# Patient Record
Sex: Male | Born: 1980 | Race: White | Hispanic: No | Marital: Married | Smoking: Current every day smoker
Health system: Southern US, Community
[De-identification: ages and names within clinical notes are randomized; demographics above are authoritative.]

## PROBLEM LIST (undated history)

## (undated) DIAGNOSIS — S069X9A Unspecified intracranial injury with loss of consciousness of unspecified duration, initial encounter: Secondary | ICD-10-CM

## (undated) DIAGNOSIS — I10 Essential (primary) hypertension: Secondary | ICD-10-CM

## (undated) DIAGNOSIS — F32A Depression, unspecified: Secondary | ICD-10-CM

## (undated) DIAGNOSIS — F419 Anxiety disorder, unspecified: Secondary | ICD-10-CM

## (undated) DIAGNOSIS — F329 Major depressive disorder, single episode, unspecified: Secondary | ICD-10-CM

## (undated) DIAGNOSIS — R569 Unspecified convulsions: Secondary | ICD-10-CM

## (undated) HISTORY — DX: Depression, unspecified: F32.A

## (undated) HISTORY — DX: Anxiety disorder, unspecified: F41.9

## (undated) HISTORY — DX: Unspecified convulsions: R56.9

## (undated) HISTORY — PX: HAND SURGERY: SHX662

## (undated) HISTORY — DX: Major depressive disorder, single episode, unspecified: F32.9

## (undated) HISTORY — DX: Unspecified intracranial injury with loss of consciousness of unspecified duration, initial encounter: S06.9X9A

## (undated) HISTORY — PX: OTHER SURGICAL HISTORY: SHX169

---

## 2001-05-19 ENCOUNTER — Emergency Department (HOSPITAL_COMMUNITY): Admission: EM | Admit: 2001-05-19 | Discharge: 2001-05-20 | Payer: Self-pay | Admitting: Emergency Medicine

## 2001-05-19 ENCOUNTER — Encounter: Payer: Self-pay | Admitting: Emergency Medicine

## 2007-02-22 ENCOUNTER — Emergency Department: Payer: Self-pay | Admitting: Emergency Medicine

## 2011-08-17 ENCOUNTER — Encounter: Payer: Self-pay | Admitting: *Deleted

## 2011-08-17 ENCOUNTER — Emergency Department (HOSPITAL_COMMUNITY)
Admission: EM | Admit: 2011-08-17 | Discharge: 2011-08-17 | Disposition: A | Discharge status: Other Acute Inpatient Hospital | Payer: Self-pay | Attending: Emergency Medicine | Admitting: Emergency Medicine

## 2011-08-17 DIAGNOSIS — F411 Generalized anxiety disorder: Secondary | ICD-10-CM | POA: Insufficient documentation

## 2011-08-17 DIAGNOSIS — S61209A Unspecified open wound of unspecified finger without damage to nail, initial encounter: Secondary | ICD-10-CM | POA: Insufficient documentation

## 2011-08-17 DIAGNOSIS — W268XXA Contact with other sharp object(s), not elsewhere classified, initial encounter: Secondary | ICD-10-CM | POA: Insufficient documentation

## 2011-08-17 DIAGNOSIS — F17200 Nicotine dependence, unspecified, uncomplicated: Secondary | ICD-10-CM | POA: Insufficient documentation

## 2011-08-17 DIAGNOSIS — M79609 Pain in unspecified limb: Secondary | ICD-10-CM | POA: Insufficient documentation

## 2011-08-17 DIAGNOSIS — IMO0001 Reserved for inherently not codable concepts without codable children: Secondary | ICD-10-CM

## 2011-08-17 MED ORDER — HYDROCODONE-ACETAMINOPHEN 5-325 MG PO TABS
2.0000 | ORAL_TABLET | Freq: Once | ORAL | Status: AC
  Administered 2011-08-17: 2 via ORAL
  Filled 2011-08-17: qty 2

## 2011-08-17 MED ORDER — IBUPROFEN 800 MG PO TABS
800.0000 mg | ORAL_TABLET | Freq: Once | ORAL | Status: AC
  Administered 2011-08-17: 800 mg via ORAL
  Filled 2011-08-17: qty 1

## 2011-08-17 MED ORDER — LIDOCAINE HCL (PF) 1 % IJ SOLN
5.0000 mL | Freq: Once | INTRAMUSCULAR | Status: AC
  Administered 2011-08-17: 5 mL

## 2011-08-17 MED ORDER — ONDANSETRON HCL 4 MG PO TABS
4.0000 mg | ORAL_TABLET | Freq: Once | ORAL | Status: AC
  Administered 2011-08-17: 4 mg via ORAL
  Filled 2011-08-17: qty 1

## 2011-08-17 MED ORDER — LIDOCAINE HCL 1 % IJ SOLN: 5.0000 mL | Freq: Once | INTRAMUSCULAR | Status: DC

## 2011-08-17 MED ORDER — HYDROCODONE-ACETAMINOPHEN 5-325 MG PO TABS: ORAL_TABLET | ORAL | Status: DC

## 2011-08-17 MED ORDER — LIDOCAINE HCL (PF) 1 % IJ SOLN
INTRAMUSCULAR | Status: AC
  Administered 2011-08-17: 5 mL
  Filled 2011-08-17: qty 5

## 2011-08-17 NOTE — ED Provider Notes (Signed)
History     CSN: [id number]  Arrival date & time 08/17/11  2016   None     Chief Complaint  Patient presents with  . Finger Injury    (Consider location/radiation/quality/duration/timing/severity/associated sxs/prior treatment) Patient is a 30 y.o. male presenting with skin laceration. The history is provided by the patient.  Laceration  The incident occurred 3 to 5 hours ago. The laceration is located on the left hand. The laceration is 2 cm in size. The laceration mechanism was a a metal edge. The pain is moderate. The pain has been constant since onset. He reports no foreign bodies present. His tetanus status is UTD.    History reviewed. No pertinent past medical history.  History reviewed. No pertinent past surgical history.  History reviewed. No pertinent family history.  History  Substance Use Topics  . Smoking status: Current Everyday Smoker  . Smokeless tobacco: Not on file  . Alcohol Use: No      Review of Systems  Constitutional: Negative for activity change.       All ROS Neg except as noted in HPI  HENT: Negative for nosebleeds and neck pain.   Eyes: Negative for photophobia and discharge.  Respiratory: Negative for cough, shortness of breath and wheezing.   Cardiovascular: Negative for chest pain and palpitations.  Gastrointestinal: Negative for abdominal pain and blood in stool.  Genitourinary: Negative for dysuria, frequency and hematuria.  Musculoskeletal: Negative for back pain and arthralgias.  Skin: Negative.   Neurological: Positive for headaches. Negative for dizziness, seizures and speech difficulty.  Psychiatric/Behavioral: Negative for hallucinations and confusion.    Allergies  Cortizone-5 and Penicillins  Home Medications   Current Outpatient Rx  Name Route Sig Dispense Refill  . BC HEADACHE POWDER PO Oral Take 1 packet by mouth daily as needed. For pain       BP 130/83  Pulse 78  Temp 98 F (36.7 C)  Resp 20  Ht 5\' 8"  (1.727  m)  Wt 150 lb (68.04 kg)  BMI 22.81 kg/m2  SpO2 99%  Physical Exam  Nursing note and vitals reviewed. Constitutional: He is oriented to person, place, and time. He appears well-developed and well-nourished.  Non-toxic appearance.  HENT:  Head: Normocephalic.  Right Ear: Tympanic membrane and external ear normal.  Left Ear: Tympanic membrane and external ear normal.  Eyes: EOM and lids are normal. Pupils are equal, round, and reactive to light.  Neck: Normal range of motion. Neck supple. Carotid bruit is not present.  Cardiovascular: Normal rate, regular rhythm, normal heart sounds, intact distal pulses and normal pulses.   Pulmonary/Chest: Breath sounds normal. No respiratory distress.  Abdominal: Soft. Bowel sounds are normal. There is no tenderness. There is no guarding.  Musculoskeletal: Normal range of motion.       Flap-type laceration of the medial aspect of the left third finger. No bone or tendon involvement. Sensory intact to touch and pain. Full range of motion of the finger without problem.  Lymphadenopathy:       Head (right side): No submandibular adenopathy present.       Head (left side): No submandibular adenopathy present.    He has no cervical adenopathy.  Neurological: He is alert and oriented to person, place, and time. He has normal strength. No cranial nerve deficit or sensory deficit.  Skin: Skin is warm and dry.  Psychiatric: His speech is normal. His mood appears anxious.    ED Course  LACERATION REPAIR Date/Time: 08/17/2011 10:06 PM  Performed by: Kathie Dike Authorized by: Kathie Dike Consent: Verbal consent obtained. Risks and benefits: risks, benefits and alternatives were discussed Consent given by: patient Patient understanding: patient states understanding of the procedure being performed Patient identity confirmed: verbally with patient Time out: Immediately prior to procedure a "time out" was called to verify the correct patient,  procedure, equipment, support staff and site/side marked as required. Body area: upper extremity Location details: left long finger Laceration length: 1.8 cm Foreign bodies: no foreign bodies Tendon involvement: none Nerve involvement: none Vascular damage: no Anesthesia: digital block Local anesthetic: lidocaine 1% without epinephrine Patient sedated: no Preparation: Patient was prepped and draped in the usual sterile fashion. Irrigation solution: saline Amount of cleaning: standard Debridement: none Degree of undermining: none Skin closure: 4-0 nylon Number of sutures: 4 Technique: simple Approximation: close Approximation difficulty: simple Dressing: 4x4 sterile gauze Patient tolerance: Patient tolerated the procedure well with no immediate complications.   (including critical care time) Pulse oximetry 99% on room air. Within normal limits by my interpretation. Labs Reviewed - No data to display No results found.   Dx: 1. Laceration left third finger.   MDM  I have reviewed nursing notes, vital signs, and all appropriate lab and imaging results for this patient. Patient advised to keep the laceration of the finger clean and dry. To have sutures removed in 7 days. To see his primary care physician or return to the emergency department if any changes problems or concerns.       Kathie Dike, [place] 08/17/11 2209
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
## 2011-08-17 NOTE — ED Notes (Signed)
Pt a/ox4. Resp even and unlabored. NAD at this time. D/C instructions reviewed with pt. Pt verbalized understanding. Pt ambulated to lobby with steady gate.  

## 2011-08-17 NOTE — ED Notes (Signed)
telfa and tube gauze dressing applied.

## 2011-08-18 NOTE — ED Provider Notes (Signed)
Medical screening examination/treatment/procedure(s) were performed by non-physician practitioner and as supervising physician I was immediately available for consultation/collaboration.   Chasey Dull W Quaniya Damas, MD 08/18/11 0002 

## 2014-09-07 ENCOUNTER — Emergency Department (HOSPITAL_COMMUNITY)
Admission: EM | Admit: 2014-09-07 | Discharge: 2014-09-08 | Discharge status: Against Medical Advice | Payer: Medicaid Other | Attending: Emergency Medicine | Admitting: Emergency Medicine

## 2014-09-07 ENCOUNTER — Encounter (HOSPITAL_COMMUNITY): Payer: Self-pay | Admitting: Emergency Medicine

## 2014-09-07 DIAGNOSIS — Z79899 Other long term (current) drug therapy: Secondary | ICD-10-CM | POA: Insufficient documentation

## 2014-09-07 DIAGNOSIS — R200 Anesthesia of skin: Secondary | ICD-10-CM | POA: Diagnosis not present

## 2014-09-07 DIAGNOSIS — Z880 Allergy status to penicillin: Secondary | ICD-10-CM | POA: Diagnosis not present

## 2014-09-07 DIAGNOSIS — R29898 Other symptoms and signs involving the musculoskeletal system: Secondary | ICD-10-CM

## 2014-09-07 DIAGNOSIS — M542 Cervicalgia: Secondary | ICD-10-CM | POA: Insufficient documentation

## 2014-09-07 DIAGNOSIS — R531 Weakness: Secondary | ICD-10-CM | POA: Insufficient documentation

## 2014-09-07 DIAGNOSIS — Z7982 Long term (current) use of aspirin: Secondary | ICD-10-CM | POA: Insufficient documentation

## 2014-09-07 DIAGNOSIS — R079 Chest pain, unspecified: Secondary | ICD-10-CM

## 2014-09-07 DIAGNOSIS — Z720 Tobacco use: Secondary | ICD-10-CM | POA: Insufficient documentation

## 2014-09-07 DIAGNOSIS — I10 Essential (primary) hypertension: Secondary | ICD-10-CM | POA: Diagnosis not present

## 2014-09-07 DIAGNOSIS — R42 Dizziness and giddiness: Secondary | ICD-10-CM | POA: Diagnosis not present

## 2014-09-07 HISTORY — DX: Essential (primary) hypertension: I10

## 2014-09-07 NOTE — ED Notes (Signed)
Pt. reports right side neck pain radiating to right jaw /right hand numbness onset this evening , denies injury , denies fever or neck stiffness.

## 2014-09-07 NOTE — ED Notes (Signed)
Pt is c/o of a tingling sensation on right side of face and right hand finger tips. Pt states he has been feeling dizzy for the last couple of days.

## 2014-09-08 ENCOUNTER — Encounter (HOSPITAL_COMMUNITY): Payer: Self-pay | Admitting: Radiology

## 2014-09-08 ENCOUNTER — Emergency Department (HOSPITAL_COMMUNITY): Payer: Medicaid Other

## 2014-09-08 ENCOUNTER — Other Ambulatory Visit (HOSPITAL_COMMUNITY): Payer: Medicaid Other

## 2014-09-08 LAB — TROPONIN I: Troponin I: <0.03 ng/mL (ref <0.031)

## 2014-09-08 LAB — COMPREHENSIVE METABOLIC PANEL
ALT: 13 U/L (ref 0–53)
AST: 17 U/L (ref 0–37)
Albumin: 3.7 g/dL (ref 3.5–5.2)
Alkaline Phosphatase: 63 U/L (ref 39–117)
Anion gap: 8 (ref 5–15)
BUN: 9 mg/dL (ref 6–23)
CO2: 26 mmol/L (ref 19–32)
Calcium: 9.3 mg/dL (ref 8.4–10.5)
Chloride: 106 mEq/L (ref 96–112)
Creatinine, Ser: 1.37 mg/dL — ABNORMAL HIGH (ref 0.50–1.35)
GFR calc Af Amer: 78 mL/min — ABNORMAL LOW (ref >90)
GFR calc non Af Amer: 67 mL/min — ABNORMAL LOW (ref >90)
Glucose, Bld: 98 mg/dL (ref 70–99)
Potassium: 4.1 mmol/L (ref 3.5–5.1)
Sodium: 140 mmol/L (ref 135–145)
Total Bilirubin: 0.3 mg/dL (ref 0.3–1.2)
Total Protein: 6.5 g/dL (ref 6.0–8.3)

## 2014-09-08 LAB — CBC WITH DIFFERENTIAL/PLATELET
Basophils Absolute: 0.1 10*3/uL (ref 0.0–0.1)
Basophils Relative: 0 % (ref 0–1)
Eosinophils Absolute: 0.5 10*3/uL (ref 0.0–0.7)
Eosinophils Relative: 3 % (ref 0–5)
HCT: 44.9 % (ref 39.0–52.0)
Hemoglobin: 15.4 g/dL (ref 13.0–17.0)
Lymphocytes Relative: 21 % (ref 12–46)
Lymphs Abs: 3.1 10*3/uL (ref 0.7–4.0)
MCH: 30.7 pg (ref 26.0–34.0)
MCHC: 34.3 g/dL (ref 30.0–36.0)
MCV: 89.6 fL (ref 78.0–100.0)
Monocytes Absolute: 1.2 10*3/uL — ABNORMAL HIGH (ref 0.1–1.0)
Monocytes Relative: 8 % (ref 3–12)
Neutro Abs: 10 10*3/uL — ABNORMAL HIGH (ref 1.7–7.7)
Neutrophils Relative %: 68 % (ref 43–77)
Platelets: 391 10*3/uL (ref 150–400)
RBC: 5.01 MIL/uL (ref 4.22–5.81)
RDW: 13 % (ref 11.5–15.5)
WBC: 14.8 10*3/uL — ABNORMAL HIGH (ref 4.0–10.5)

## 2014-09-08 LAB — PROTIME-INR
INR: 1.05 (ref 0.00–1.49)
Prothrombin Time: 13.8 seconds (ref 11.6–15.2)

## 2014-09-08 MED ORDER — SODIUM CHLORIDE 0.9 % IV BOLUS (SEPSIS)
500.0000 mL | Freq: Once | INTRAVENOUS | Status: AC
  Administered 2014-09-08: 500 mL via INTRAVENOUS

## 2014-09-08 MED ORDER — IOHEXOL 350 MG/ML SOLN
50.0000 mL | Freq: Once | INTRAVENOUS | Status: AC | PRN
  Administered 2014-09-08: 50 mL via INTRAVENOUS

## 2014-09-08 MED ORDER — NICOTINE 21 MG/24HR TD PT24
21.0000 mg | MEDICATED_PATCH | Freq: Every day | TRANSDERMAL | Status: DC
  Filled 2014-09-08: qty 1

## 2014-09-08 NOTE — ED Provider Notes (Signed)
CSN: [id number]     Arrival date & time 09/07/14  2326 History  This chart was scribed for Purvis SheffieldForrest Corinn Stoltzfus, MD by Abel PrestoKara Demonbreun, ED Scribe. This patient was seen in room D32C/D32C and the patient's care was started at 12:02 AM.     Chief Complaint  Patient presents with  . Neck Pain    Patient is a 34 y.o. male presenting with neck pain. The history is provided by the patient. No language interpreter was used.  Neck Pain Associated symptoms: chest pain (resolved), numbness and weakness   Associated symptoms: no headaches     HPI Comments: Raymond Mckinney is a 34 y.o. male with PMHx of HTN who presents to the Emergency Department complaining of intermittent generalized throbbing neck pain with onset around 3 AM 3 days ago. Pt notes constant chest pain 3 days ago which has resolved. Pt states it may have been due to anxiety.  Pt attended a funeral on that day. Pt states neck pain is worse on the right today. Pt notes around 10 PM this evening he felt dizzy and noticed right sided tingling in jaw and cheek and right arm with associated weakness, causing him to drop the phone he was holding. Pt states his right side felt numb following this episode. Pt notes symptoms lasted between 20-30 minutes with constant dizziness for 15 minutes. Pt states wife noted right sided facial droop.  Pt is a smoker and notes occasional EtOH use.  Pt is allergic to Cortizone 10 and penicillin. Pt denies recent surgeries, headache, and visual disturbances.   Past Medical History  Diagnosis Date  . Hypertension    History reviewed. No pertinent past surgical history. No family history on file. History  Substance Use Topics  . Smoking status: Current Every Day Smoker  . Smokeless tobacco: Not on file  . Alcohol Use: No    Review of Systems  Constitutional: Negative for appetite change and fatigue.  HENT: Negative for congestion, ear discharge and sinus pressure.   Eyes: Negative for discharge and visual  disturbance.  Respiratory: Negative for cough.   Cardiovascular: Positive for chest pain (resolved).  Gastrointestinal: Negative for abdominal pain and diarrhea.  Genitourinary: Negative for frequency and hematuria.  Musculoskeletal: Positive for neck pain. Negative for back pain.  Skin: Negative for rash.  Neurological: Positive for dizziness, weakness and numbness. Negative for seizures and headaches.  Psychiatric/Behavioral: Negative for hallucinations.  All other systems reviewed and are negative.     Allergies  Cortizone-5 and Penicillins  Home Medications   Prior to Admission medications   Medication Sig Start Date End Date Taking? Authorizing Provider  Aspirin-Salicylamide-Caffeine (BC HEADACHE POWDER PO) Take 1 packet by mouth daily as needed. For pain     Historical Provider, MD  HYDROcodone-acetaminophen [facility]) 5-325 MG per tablet 1 or 2 po q4h prn pain 08/17/11   Kathie DikeHobson M Bryant, PA-C   BP 126/76 mmHg  Pulse 95  Temp(Src) 97.7 F (36.5 C) (Oral)  Resp 16  Ht 5\' 8"  (1.727 m)  Wt 150 lb (68.04 kg)  BMI 22.81 kg/m2  SpO2 100% Physical Exam  Constitutional: He is oriented to person, place, and time. He appears well-developed.  HENT:  Head: Normocephalic.  Mouth/Throat: Oropharynx is clear and moist.  Eyes: Conjunctivae and EOM are normal. Pupils are equal, round, and reactive to light. No scleral icterus.  Neck: Normal range of motion. Neck supple. Carotid bruit is not present. No thyromegaly present.  Cardiovascular: Normal rate and regular  rhythm.  Exam reveals no gallop and no friction rub.   No murmur heard. Pulmonary/Chest: No stridor. He has no wheezes. He has no rales. He exhibits no tenderness.  Abdominal: He exhibits no distension. There is no tenderness. There is no rebound.  Musculoskeletal: Normal range of motion. He exhibits no edema.  Lymphadenopathy:    He has no cervical adenopathy.  Neurological: He is oriented to person, place, and time. He  exhibits normal muscle tone. Coordination normal.  alert, oriented x3 speech: normal in context and clarity memory: intact grossly cranial nerves II-XII: intact motor strength: full proximally and distally no involuntary movements or tremors sensation: intact to light touch diffusely  cerebellar: finger-to-nose and heel-to-shin intact gait: normal forwards and backwards  Skin: No rash noted. No erythema.  Psychiatric: He has a normal mood and affect. His behavior is normal.  Nursing note and vitals reviewed.   ED Course  Procedures (including critical care time) DIAGNOSTIC STUDIES: Oxygen Saturation is 100% on room air, normal by my interpretation.    COORDINATION OF CARE: 12:13 AM Discussed treatment plan with patient at beside, the patient agrees with the plan and has no further questions at this time.   Labs Review Labs Reviewed  CBC WITH DIFFERENTIAL - Abnormal; Notable for the following:    WBC 14.8 (*)    Neutro Abs 10.0 (*)    Monocytes Absolute 1.2 (*)    All other components within normal limits  COMPREHENSIVE METABOLIC PANEL - Abnormal; Notable for the following:    Creatinine, Ser 1.37 (*)    GFR calc non Af Amer 67 (*)    GFR calc Af Amer 78 (*)    All other components within normal limits  TROPONIN I  PROTIME-INR    Imaging Review Ct Angio Head W/cm &/or Wo Cm  09/08/2014   CLINICAL DATA:  Neck pain on the right. Unspecified chest pain. Weakness of the right hand and tingling of the right face.  EXAM: CT ANGIOGRAPHY HEAD AND NECK  TECHNIQUE: Multidetector CT imaging of the head and neck was performed using the standard protocol during bolus administration of intravenous contrast. Multiplanar CT image reconstructions and MIPs were obtained to evaluate the vascular anatomy. Carotid stenosis measurements (when applicable) are obtained utilizing NASCET criteria, using the distal internal carotid diameter as the denominator.  CONTRAST:  50mL OMNIPAQUE IOHEXOL 350  MG/ML SOLN  COMPARISON:  None.  FINDINGS: CT HEAD  Skull and Sinuses:Mucosal thickening and fluid levels in the bilateral maxillary sinuses.  Orbits: No acute abnormality.  Brain: No evidence of acute infarction, hemorrhage, hydrocephalus, or mass lesion/mass effect.  CTA NECK  Aortic arch: Minimal imaging shows no evidence of dissection or aneurysm. There is standard branching of the aortic arch.  Right carotid system: No evidence of dissection, vasculopathy, stenosis, or atherosclerosis. Size is symmetric.  Left carotid system: No evidence of dissection, vasculopathy, stenosis, or atherosclerosis. Size is symmetric.  Vertebral arteries: Slight left dominance. No stenosis, vasculopathy, or dissection.  CTA HEAD  Anterior circulation: There is no aneurysm, stenosis, or atherosclerotic change. No evidence of vascular malformation.  Posterior circulation: There is no aneurysm, stenosis, or atherosclerotic change. No evidence of vascular malformation.  Venous sinuses: Patent.  Anatomic variants: Fetal type left PCA. Extradural origin of the right PICA.  Delayed phase: Normal.  Skull and Sinuses:No findings to explain the history.  IMPRESSION: 1. Negative CTA of the head and neck. 2. Bilateral maxillary sinusitis.   Electronically Signed   By: Christiane Ha  Watts M.D.   On: 09/08/2014 01:48   Dg Chest 2 View  09/08/2014   CLINICAL DATA:  Chest pain, RIGHT neck pain beginning 3 days ago after attending a funeral. Dizziness for a few days.  EXAM: CHEST  2 VIEW  COMPARISON:  Chest radiograph June 19, 2014  FINDINGS: Cardiomediastinal silhouette is unremarkable. Mild bronchitic changes. The lungs are clear without pleural effusions or focal consolidations. Trachea projects midline and there is no pneumothorax. Soft tissue planes and included osseous structures are non-suspicious.  IMPRESSION: Mild bronchitic change without focal consolidation.   Electronically Signed   By: Awilda Metro   On: 09/08/2014 00:36   Ct  Angio Neck W/cm &/or Wo/cm  09/08/2014   CLINICAL DATA:  Neck pain on the right. Unspecified chest pain. Weakness of the right hand and tingling of the right face.  EXAM: CT ANGIOGRAPHY HEAD AND NECK  TECHNIQUE: Multidetector CT imaging of the head and neck was performed using the standard protocol during bolus administration of intravenous contrast. Multiplanar CT image reconstructions and MIPs were obtained to evaluate the vascular anatomy. Carotid stenosis measurements (when applicable) are obtained utilizing NASCET criteria, using the distal internal carotid diameter as the denominator.  CONTRAST:  50mL OMNIPAQUE IOHEXOL 350 MG/ML SOLN  COMPARISON:  None.  FINDINGS: CT HEAD  Skull and Sinuses:Mucosal thickening and fluid levels in the bilateral maxillary sinuses.  Orbits: No acute abnormality.  Brain: No evidence of acute infarction, hemorrhage, hydrocephalus, or mass lesion/mass effect.  CTA NECK  Aortic arch: Minimal imaging shows no evidence of dissection or aneurysm. There is standard branching of the aortic arch.  Right carotid system: No evidence of dissection, vasculopathy, stenosis, or atherosclerosis. Size is symmetric.  Left carotid system: No evidence of dissection, vasculopathy, stenosis, or atherosclerosis. Size is symmetric.  Vertebral arteries: Slight left dominance. No stenosis, vasculopathy, or dissection.  CTA HEAD  Anterior circulation: There is no aneurysm, stenosis, or atherosclerotic change. No evidence of vascular malformation.  Posterior circulation: There is no aneurysm, stenosis, or atherosclerotic change. No evidence of vascular malformation.  Venous sinuses: Patent.  Anatomic variants: Fetal type left PCA. Extradural origin of the right PICA.  Delayed phase: Normal.  Skull and Sinuses:No findings to explain the history.  IMPRESSION: 1. Negative CTA of the head and neck. 2. Bilateral maxillary sinusitis.   Electronically Signed   By: Tiburcio Pea M.D.   On: 09/08/2014 01:48      EKG Interpretation   Date/Time:  Wednesday September 08 2014 00:17:10 EST Ventricular Rate:  86 PR Interval:  147 QRS Duration: 83 QT Interval:  344 QTC Calculation: 411 R Axis:   79 Text Interpretation:  Sinus rhythm ST elev, probable normal early repol  pattern Confirmed by Genevieve Arbaugh  MD, Roselynn Whitacre ([id number]) on 09/08/2014 12:22:15 AM      MDM   Final diagnoses:  Chest pain  Neck pain on right side  Right hand weakness   1:02 AM 34 y.o. male pw right hand weakness tingling, right jaw tingling, dizziness of sudden onset around 10pm. Sx resolved after about 15-20 min. Ant neck pain, worse on right for several days. No fevers, HA's, or cp. AFVSS here. Normal neuro exam. Will get CTA head/neck.   I discussed the case with neurology who recommended a MRI. I informed the patient. He seemed to be in agreement, but eloped w/out warning shortly after our discussion. His workup was thus far noncontributory. I did not get to give him return precautions. He had remained asx  on exam w/ the exception of some tingling in the fingertips of his right hand.     I personally performed the services described in this documentation, which was scribed in my presence. The recorded information has been reviewed and is accurate.      Purvis Sheffield, MD 09/08/14 [phone]
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
## 2014-11-08 ENCOUNTER — Emergency Department (HOSPITAL_COMMUNITY)
Admission: EM | Admit: 2014-11-08 | Discharge: 2014-11-09 | Disposition: A | Discharge status: Home / Self Care | Payer: Medicaid Other | Attending: Emergency Medicine | Admitting: Emergency Medicine

## 2014-11-08 ENCOUNTER — Emergency Department (HOSPITAL_COMMUNITY): Payer: Medicaid Other

## 2014-11-08 ENCOUNTER — Encounter (HOSPITAL_COMMUNITY): Payer: Self-pay | Admitting: Emergency Medicine

## 2014-11-08 DIAGNOSIS — Y998 Other external cause status: Secondary | ICD-10-CM | POA: Insufficient documentation

## 2014-11-08 DIAGNOSIS — Y929 Unspecified place or not applicable: Secondary | ICD-10-CM | POA: Insufficient documentation

## 2014-11-08 DIAGNOSIS — Z880 Allergy status to penicillin: Secondary | ICD-10-CM | POA: Diagnosis not present

## 2014-11-08 DIAGNOSIS — Z720 Tobacco use: Secondary | ICD-10-CM | POA: Diagnosis not present

## 2014-11-08 DIAGNOSIS — T1490XA Injury, unspecified, initial encounter: Secondary | ICD-10-CM

## 2014-11-08 DIAGNOSIS — I10 Essential (primary) hypertension: Secondary | ICD-10-CM | POA: Insufficient documentation

## 2014-11-08 DIAGNOSIS — Y9389 Activity, other specified: Secondary | ICD-10-CM | POA: Insufficient documentation

## 2014-11-08 DIAGNOSIS — S61207A Unspecified open wound of left little finger without damage to nail, initial encounter: Secondary | ICD-10-CM | POA: Diagnosis present

## 2014-11-08 DIAGNOSIS — W3400XA Accidental discharge from unspecified firearms or gun, initial encounter: Secondary | ICD-10-CM | POA: Insufficient documentation

## 2014-11-08 DIAGNOSIS — S61239A Puncture wound without foreign body of unspecified finger without damage to nail, initial encounter: Secondary | ICD-10-CM

## 2014-11-08 MED ORDER — OXYCODONE-ACETAMINOPHEN 5-325 MG PO TABS
1.0000 | ORAL_TABLET | Freq: Once | ORAL | Status: AC
  Administered 2014-11-08: 1 via ORAL
  Filled 2014-11-08: qty 1

## 2014-11-08 MED ORDER — OXYCODONE-ACETAMINOPHEN 5-325 MG PO TABS: 1.0000 | ORAL_TABLET | Freq: Four times a day (QID) | ORAL | Status: DC | PRN

## 2014-11-08 MED ORDER — CEPHALEXIN 500 MG PO CAPS: 500.0000 mg | ORAL_CAPSULE | Freq: Four times a day (QID) | ORAL | Status: DC
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
## 2014-11-08 NOTE — ED Notes (Signed)
Ortho tech called to apply ulnar gutter

## 2014-11-08 NOTE — ED Notes (Signed)
Ortho tech at bedside 
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
## 2014-11-08 NOTE — ED Notes (Signed)
Pt transported to xray 

## 2014-12-20 ENCOUNTER — Emergency Department (HOSPITAL_COMMUNITY): Payer: Medicaid Other

## 2014-12-20 ENCOUNTER — Emergency Department (HOSPITAL_COMMUNITY)
Admission: EM | Admit: 2014-12-20 | Discharge: 2014-12-21 | Disposition: A | Discharge status: Home / Self Care | Payer: Medicaid Other | Attending: Emergency Medicine | Admitting: Emergency Medicine

## 2014-12-20 ENCOUNTER — Encounter (HOSPITAL_COMMUNITY): Payer: Self-pay

## 2014-12-20 DIAGNOSIS — R4781 Slurred speech: Secondary | ICD-10-CM | POA: Diagnosis not present

## 2014-12-20 DIAGNOSIS — Y9389 Activity, other specified: Secondary | ICD-10-CM | POA: Insufficient documentation

## 2014-12-20 DIAGNOSIS — Y92410 Unspecified street and highway as the place of occurrence of the external cause: Secondary | ICD-10-CM | POA: Diagnosis not present

## 2014-12-20 DIAGNOSIS — F10120 Alcohol abuse with intoxication, uncomplicated: Secondary | ICD-10-CM | POA: Diagnosis not present

## 2014-12-20 DIAGNOSIS — S3992XA Unspecified injury of lower back, initial encounter: Secondary | ICD-10-CM | POA: Diagnosis not present

## 2014-12-20 DIAGNOSIS — Z720 Tobacco use: Secondary | ICD-10-CM | POA: Diagnosis not present

## 2014-12-20 DIAGNOSIS — S80812A Abrasion, left lower leg, initial encounter: Secondary | ICD-10-CM | POA: Insufficient documentation

## 2014-12-20 DIAGNOSIS — Y998 Other external cause status: Secondary | ICD-10-CM | POA: Insufficient documentation

## 2014-12-20 DIAGNOSIS — E876 Hypokalemia: Secondary | ICD-10-CM | POA: Diagnosis not present

## 2014-12-20 DIAGNOSIS — S80811A Abrasion, right lower leg, initial encounter: Secondary | ICD-10-CM | POA: Insufficient documentation

## 2014-12-20 DIAGNOSIS — F10920 Alcohol use, unspecified with intoxication, uncomplicated: Secondary | ICD-10-CM

## 2014-12-20 DIAGNOSIS — S8992XA Unspecified injury of left lower leg, initial encounter: Secondary | ICD-10-CM | POA: Diagnosis present

## 2014-12-20 LAB — PREPARE FRESH FROZEN PLASMA
Unit division: 0
Unit division: 0

## 2014-12-20 LAB — URINALYSIS, ROUTINE W REFLEX MICROSCOPIC
Bilirubin Urine: NEGATIVE
Glucose, UA: NEGATIVE mg/dL
Ketones, ur: NEGATIVE mg/dL
Leukocytes, UA: NEGATIVE
Nitrite: NEGATIVE
Protein, ur: NEGATIVE mg/dL
Specific Gravity, Urine: 1.005 (ref 1.005–1.030)
Urobilinogen, UA: 0.2 mg/dL (ref 0.0–1.0)
pH: 5.5 (ref 5.0–8.0)

## 2014-12-20 LAB — CBC WITH DIFFERENTIAL/PLATELET
Basophils Absolute: 0.1 10*3/uL (ref 0.0–0.1)
Basophils Relative: 1 % (ref 0–1)
Eosinophils Absolute: 0.2 10*3/uL (ref 0.0–0.7)
Eosinophils Relative: 2 % (ref 0–5)
HCT: 45.9 % (ref 39.0–52.0)
Hemoglobin: 15.6 g/dL (ref 13.0–17.0)
Lymphocytes Relative: 34 % (ref 12–46)
Lymphs Abs: 3.4 10*3/uL (ref 0.7–4.0)
MCH: 30.4 pg (ref 26.0–34.0)
MCHC: 34 g/dL (ref 30.0–36.0)
MCV: 89.3 fL (ref 78.0–100.0)
Monocytes Absolute: 0.7 10*3/uL (ref 0.1–1.0)
Monocytes Relative: 7 % (ref 3–12)
Neutro Abs: 5.8 10*3/uL (ref 1.7–7.7)
Neutrophils Relative %: 56 % (ref 43–77)
Platelets: 359 10*3/uL (ref 150–400)
RBC: 5.14 MIL/uL (ref 4.22–5.81)
RDW: 13.4 % (ref 11.5–15.5)
WBC: 10.1 10*3/uL (ref 4.0–10.5)

## 2014-12-20 LAB — COMPREHENSIVE METABOLIC PANEL
ALT: 24 U/L (ref 17–63)
AST: 29 U/L (ref 15–41)
Albumin: 4 g/dL (ref 3.5–5.0)
Alkaline Phosphatase: 69 U/L (ref 38–126)
Anion gap: 13 (ref 5–15)
BUN: 6 mg/dL (ref 6–20)
CO2: 19 mmol/L — ABNORMAL LOW (ref 22–32)
Calcium: 8.8 mg/dL — ABNORMAL LOW (ref 8.9–10.3)
Chloride: 111 mmol/L (ref 101–111)
Creatinine, Ser: 1.03 mg/dL (ref 0.61–1.24)
GFR calc Af Amer: >60 mL/min (ref >60)
GFR calc non Af Amer: >60 mL/min (ref >60)
Glucose, Bld: 91 mg/dL (ref 70–99)
Potassium: 2.9 mmol/L — ABNORMAL LOW (ref 3.5–5.1)
Sodium: 143 mmol/L (ref 135–145)
Total Bilirubin: 0.5 mg/dL (ref 0.3–1.2)
Total Protein: 7.2 g/dL (ref 6.5–8.1)

## 2014-12-20 LAB — TYPE AND SCREEN
ABO/RH(D): A POS
Antibody Screen: NEGATIVE
Unit division: 0
Unit division: 0

## 2014-12-20 LAB — I-STAT CHEM 8, ED
BUN: 6 mg/dL (ref 6–20)
Calcium, Ion: 1.11 mmol/L — ABNORMAL LOW (ref 1.12–1.23)
Chloride: 109 mmol/L (ref 101–111)
Creatinine, Ser: 1.4 mg/dL — ABNORMAL HIGH (ref 0.61–1.24)
Glucose, Bld: 87 mg/dL (ref 70–99)
HCT: 50 % (ref 39.0–52.0)
Hemoglobin: 17 g/dL (ref 13.0–17.0)
Potassium: 3 mmol/L — ABNORMAL LOW (ref 3.5–5.1)
Sodium: 146 mmol/L — ABNORMAL HIGH (ref 135–145)
TCO2: 16 mmol/L (ref 0–100)

## 2014-12-20 LAB — URINE MICROSCOPIC-ADD ON

## 2014-12-20 LAB — CDS SEROLOGY

## 2014-12-20 LAB — ETHANOL: Alcohol, Ethyl (B): 283 mg/dL — ABNORMAL HIGH (ref <5)

## 2014-12-20 LAB — LACTIC ACID, PLASMA: Lactic Acid, Venous: 2.4 mmol/L (ref 0.5–2.0)

## 2014-12-20 LAB — PROTIME-INR
INR: 1.05 (ref 0.00–1.49)
Prothrombin Time: 13.8 seconds (ref 11.6–15.2)

## 2014-12-20 MED ORDER — HALOPERIDOL LACTATE 5 MG/ML IJ SOLN
INTRAMUSCULAR | Status: AC
  Administered 2014-12-20: 5 mg
  Filled 2014-12-20: qty 1

## 2014-12-20 MED ORDER — FENTANYL CITRATE (PF) 100 MCG/2ML IJ SOLN
50.0000 ug | Freq: Once | INTRAMUSCULAR | Status: AC
  Administered 2014-12-20: 50 ug via INTRAVENOUS
  Filled 2014-12-20: qty 2

## 2014-12-20 MED ORDER — POTASSIUM CHLORIDE 20 MEQ/15ML (10%) PO SOLN
40.0000 meq | Freq: Once | ORAL | Status: AC
  Administered 2014-12-20: 40 meq via ORAL
  Filled 2014-12-20: qty 30

## 2014-12-20 MED ORDER — SODIUM CHLORIDE 0.9 % IV BOLUS (SEPSIS)
1000.0000 mL | Freq: Once | INTRAVENOUS | Status: AC
  Administered 2014-12-20: 1000 mL via INTRAVENOUS
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
## 2014-12-21 ENCOUNTER — Encounter (HOSPITAL_COMMUNITY): Payer: Self-pay | Admitting: Radiology

## 2014-12-21 ENCOUNTER — Emergency Department (HOSPITAL_COMMUNITY): Payer: Medicaid Other

## 2014-12-21 LAB — ABO/RH: ABO/RH(D): A POS

## 2014-12-21 MED ORDER — POTASSIUM CHLORIDE IN NACL 20-0.9 MEQ/L-% IV SOLN
Freq: Once | INTRAVENOUS | Status: AC
Start: 2014-12-21 — End: 2014-12-21
  Administered 2014-12-21: 02:00:00 via INTRAVENOUS
  Filled 2014-12-21: qty 1000

## 2014-12-21 MED ORDER — FENTANYL CITRATE (PF) 100 MCG/2ML IJ SOLN
50.0000 ug | INTRAMUSCULAR | Status: DC | PRN
  Filled 2014-12-21: qty 2

## 2014-12-21 MED ORDER — IBUPROFEN 600 MG PO TABS: 600.0000 mg | ORAL_TABLET | Freq: Four times a day (QID) | ORAL | Status: DC | PRN

## 2014-12-21 MED ORDER — CYCLOBENZAPRINE HCL 10 MG PO TABS
10.0000 mg | ORAL_TABLET | Freq: Two times a day (BID) | ORAL | Status: DC | PRN
Start: 2014-12-21 — End: 2015-04-19

## 2014-12-21 MED ORDER — IOHEXOL 300 MG/ML  SOLN: 80.0000 mL | Freq: Once | INTRAMUSCULAR | Status: DC | PRN

## 2014-12-21 NOTE — Discharge Instructions (Signed)
Alcohol Intoxication  Alcohol intoxication occurs when the amount of alcohol that a person has consumed impairs his or her ability to mentally and physically function. Alcohol directly impairs the normal chemical activity of the brain. Drinking large amounts of alcohol can lead to changes in mental function and behavior, and it can cause many physical effects that can be harmful.   Alcohol intoxication can range in severity from mild to very severe. Various factors can affect the level of intoxication that occurs, such as the person's age, gender, weight, frequency of alcohol consumption, and the presence of other medical conditions (such as diabetes, seizures, or heart conditions). Dangerous levels of alcohol intoxication may occur when people drink large amounts of alcohol in a short period (binge drinking). Alcohol can also be especially dangerous when combined with certain prescription medicines or "recreational" drugs.  SIGNS AND SYMPTOMS  Some common signs and symptoms of mild alcohol intoxication include:  · Loss of coordination.  · Changes in mood and behavior.  · Impaired judgment.  · Slurred speech.  As alcohol intoxication progresses to more severe levels, other signs and symptoms will appear. These may include:  · Vomiting.  · Confusion and impaired memory.  · Slowed breathing.  · Seizures.  · Loss of consciousness.  DIAGNOSIS   Your health care provider will take a medical history and perform a physical exam. You will be asked about the amount and type of alcohol you have consumed. Blood tests will be done to measure the concentration of alcohol in your blood. In many places, your blood alcohol level must be lower than 80 mg/dL (0.08%) to legally drive. However, many dangerous effects of alcohol can occur at much lower levels.   TREATMENT   People with alcohol intoxication often do not require treatment. Most of the effects of alcohol intoxication are temporary, and they go away as the alcohol naturally  leaves the body. Your health care provider will monitor your condition until you are stable enough to go home. Fluids are sometimes given through an IV access tube to help prevent dehydration.   HOME CARE INSTRUCTIONS  · Do not drive after drinking alcohol.  · Stay hydrated. Drink enough water and fluids to keep your urine clear or pale yellow. Avoid caffeine.    · Only take over-the-counter or prescription medicines as directed by your health care provider.    SEEK MEDICAL CARE IF:   · You have persistent vomiting.    · You do not feel better after a few days.  · You have frequent alcohol intoxication. Your health care provider can help determine if you should see a substance use treatment counselor.  SEEK IMMEDIATE MEDICAL CARE IF:   · You become shaky or tremble when you try to stop drinking.    · You shake uncontrollably (seizure).    · You throw up (vomit) blood. This may be bright red or may look like black coffee grounds.    · You have blood in your stool. This may be bright red or may appear as a black, tarry, bad smelling stool.    · You become lightheaded or faint.    MAKE SURE YOU:   · Understand these instructions.  · Will watch your condition.  · Will get help right away if you are not doing well or get worse.  Document Released: 05/16/2005 Document Revised: 04/08/2013 Document Reviewed: 01/09/2013  [employer]® Patient Information ©2015 [employer]. This information is not intended to replace advice given to you by your health care provider. Make sure   you discuss any questions you have with your health care provider.

## 2014-12-21 NOTE — ED Notes (Signed)
Patient transported to CT 

## 2014-12-21 NOTE — ED Notes (Signed)
Patient back in room and is agreeing to have an IV restarted but does not want to have any blood drawn.  Dr Delford FieldWright talked with patient and he is willing to have the CT done.  Patient talking with [employer] and is agreeable to having this nurse start another IV

## 2014-12-22 ENCOUNTER — Encounter (HOSPITAL_COMMUNITY): Payer: Self-pay | Admitting: Emergency Medicine

## 2015-04-19 ENCOUNTER — Emergency Department (HOSPITAL_COMMUNITY): Payer: Medicaid Other

## 2015-04-19 ENCOUNTER — Emergency Department (HOSPITAL_COMMUNITY)
Admission: EM | Admit: 2015-04-19 | Discharge: 2015-04-19 | Disposition: A | Discharge status: Home / Self Care | Payer: Medicaid Other | Attending: Emergency Medicine | Admitting: Emergency Medicine

## 2015-04-19 ENCOUNTER — Encounter (HOSPITAL_COMMUNITY): Payer: Self-pay

## 2015-04-19 DIAGNOSIS — R200 Anesthesia of skin: Secondary | ICD-10-CM | POA: Insufficient documentation

## 2015-04-19 DIAGNOSIS — Z792 Long term (current) use of antibiotics: Secondary | ICD-10-CM | POA: Insufficient documentation

## 2015-04-19 DIAGNOSIS — Z880 Allergy status to penicillin: Secondary | ICD-10-CM | POA: Diagnosis not present

## 2015-04-19 DIAGNOSIS — I10 Essential (primary) hypertension: Secondary | ICD-10-CM | POA: Diagnosis not present

## 2015-04-19 DIAGNOSIS — Z720 Tobacco use: Secondary | ICD-10-CM | POA: Diagnosis not present

## 2015-04-19 DIAGNOSIS — M5412 Radiculopathy, cervical region: Secondary | ICD-10-CM | POA: Insufficient documentation

## 2015-04-19 DIAGNOSIS — M542 Cervicalgia: Secondary | ICD-10-CM | POA: Diagnosis present

## 2015-04-19 MED ORDER — CYCLOBENZAPRINE HCL 10 MG PO TABS: 10.0000 mg | ORAL_TABLET | Freq: Three times a day (TID) | ORAL | Status: DC | PRN

## 2015-04-19 MED ORDER — NAPROXEN 500 MG PO TABS: 500.0000 mg | ORAL_TABLET | Freq: Two times a day (BID) | ORAL | Status: DC

## 2015-04-19 MED ORDER — OXYCODONE-ACETAMINOPHEN 5-325 MG PO TABS
1.0000 | ORAL_TABLET | Freq: Once | ORAL | Status: AC
  Administered 2015-04-19: 1 via ORAL
  Filled 2015-04-19: qty 1

## 2015-04-19 MED ORDER — OXYCODONE-ACETAMINOPHEN 5-325 MG PO TABS: 1.0000 | ORAL_TABLET | ORAL | Status: DC | PRN

## 2015-04-19 NOTE — Discharge Instructions (Signed)
Cervical Radiculopathy °Cervical radiculopathy means a nerve in the neck is pinched or bruised. This can cause pain or loss of feeling (numbness) that runs from your neck to your arm and fingers. °HOME CARE  °· Put ice on the injured or painful area. °¨ Put ice in a plastic bag. °¨ Place a towel between your skin and the bag. °¨ Leave the ice on for 15-20 minutes, 03-04 times a day, or as told by your doctor. °· If ice does not help, you can try using heat. Take a warm shower or bath, or use a hot water bottle as told by your doctor. °· You may try a gentle neck and shoulder massage. °· Use a flat pillow when you sleep. °· Only take medicines as told by your doctor. °· Keep all physical therapy visits as told by your doctor. °· If you are given a soft collar, wear it as told by your doctor. °GET HELP RIGHT AWAY IF:  °· Your pain gets worse and is not controlled with medicine. °· You lose feeling or feel weak in your hand, arm, face, or leg. °· You have a fever or stiff neck. °· You cannot control when you poop or pee (incontinence). °· You have trouble with walking, balance, or speaking. °MAKE SURE YOU:  °· Understand these instructions. °· Will watch your condition. °· Will get help right away if you are not doing well or get worse. °Document Released: 07/26/2011 Document Revised: 10/29/2011 Document Reviewed: 07/26/2011 °Exit[employer]® Patient Information ©2015 [employer]. This information is not intended to replace advice given to you by your health care provider. Make sure you discuss any questions you have with your health care provider. ° °

## 2015-04-19 NOTE — ED Notes (Signed)
Tingling on the left side of my neck and tingling and burning in both hands with a sharp pain in my upper right arm per pt. Has been bothering me for the past two weeks per pt.

## 2015-04-21 NOTE — ED Provider Notes (Signed)
CSN: [id number]     Arrival date & time 04/19/15  2110 History   First MD Initiated Contact with Patient 04/19/15 2125     Chief Complaint  Patient presents with  . Neck Pain     (Consider location/radiation/quality/duration/timing/severity/associated sxs/prior Treatment) HPI   Raymond Mckinney is a 34 y.o. male who presents to the Emergency Department complaining of of neck pain and tingling sensation to both arms.  He states the pain has been present for approximately 2 weeks.  Pain is associated with tingling and intermittent numbness of his left arm to his fingers and to the right inner bicep area.  Pain is worse with movement of his neck.  He describes the pain as sharp.  He has not tried any therapies prior to ED arrival.  He denies recent injury, headaches, visual changes, upper extremity weakness,swelling or fever.    Past Medical History  Diagnosis Date  . Hypertension    Past Surgical History  Procedure Laterality Date  . Other surgical history Left Hand surgery  . Hand surgery     History reviewed. No pertinent family history. Social History  Substance Use Topics  . Smoking status: Current Some Day Smoker  . Smokeless tobacco: None  . Alcohol Use: Yes    Review of Systems  Constitutional: Negative for fever and chills.  Respiratory: Negative for shortness of breath.   Cardiovascular: Negative for chest pain.  Musculoskeletal: Positive for arthralgias and neck pain. Negative for joint swelling.  Skin: Negative for color change and wound.  Neurological: Positive for numbness (tingling of both arms). Negative for dizziness, seizures, syncope, weakness and headaches.  Psychiatric/Behavioral: Negative for confusion.  All other systems reviewed and are negative.     Allergies  Corticosteroids; Cortizone-5; Shellfish allergy; Penicillins; and Penicillins  Home Medications   Prior to Admission medications   Medication Sig Start Date End Date Taking? Authorizing  Provider  cephALEXin (KEFLEX) 500 MG capsule Take 1 capsule (500 mg total) by mouth 4 (four) times daily. 11/08/14   Dorna Leitz, MD  cyclobenzaprine (FLEXERIL) 10 MG tablet Take 1 tablet (10 mg total) by mouth 3 (three) times daily as needed. 04/19/15   Agatha Duplechain, PA-C  ibuprofen (ADVIL,MOTRIN) 600 MG tablet Take 1 tablet (600 mg total) by mouth every 6 (six) hours as needed. 12/21/14   Lenell Antu, MD  naproxen (NAPROSYN) 500 MG tablet Take 1 tablet (500 mg total) by mouth 2 (two) times daily with a meal. 04/19/15   Waller Marcussen, PA-C  oxyCODONE-acetaminophen (PERCOCET/ROXICET) 5-325 MG per tablet Take 1 tablet by mouth every 4 (four) hours as needed. 04/19/15   Sanjna Haskew, PA-C   BP 118/76 mmHg  Pulse 75  Temp(Src) 98.2 F (36.8 C) (Oral)  Resp 18  Ht  (1.702 m)  Wt 150 lb (68.04 kg)  BMI 23.49 kg/m2  SpO2 100% Physical Exam  Constitutional: He is oriented to person, place, and time. He appears well-developed and well-nourished. No distress.  HENT:  Head: Normocephalic and atraumatic.  Neck: Normal range of motion.  Cardiovascular: Normal rate, regular rhythm and intact distal pulses.   No murmur heard. Pulmonary/Chest: Effort normal and breath sounds normal. He exhibits no tenderness.  Musculoskeletal: He exhibits no edema.       Cervical back: He exhibits tenderness. He exhibits no bony tenderness, no swelling and normal pulse.       Back:  Tender to palp along the left cervical paraspinal muscles and left trapezius.  No edema  or erythema.  Pt has full ROM of the neck.  5/5 strength against resistance of the bilateral UE's, distal sensation intact, radail pulse is brisk bilaterally  Neurological: He is alert and oriented to person, place, and time. He has normal strength. No sensory deficit.  Reflex Scores:      Tricep reflexes are 2+ on the right side and 2+ on the left side.      Bicep reflexes are 2+ on the right side and 2+ on the left side. Skin: Skin is warm and  dry.  Psychiatric: He has a normal mood and affect.  Nursing note and vitals reviewed.   ED Course  Procedures (including critical care time) Labs Review Labs Reviewed - No data to display  Imaging Review Ct Cervical Spine Wo Contrast  04/19/2015   CLINICAL DATA:  Left neck tingling  EXAM: CT CERVICAL SPINE WITHOUT CONTRAST  TECHNIQUE: Multidetector CT imaging of the cervical spine was performed without intravenous contrast. Multiplanar CT image reconstructions were also generated.  COMPARISON:  CT angio neck 09/08/2014  FINDINGS: Mild reversal of the normal cervical lordosis at C5. Vertebral body heights and intervertebral disc spaces are maintained. No fracture or dislocation. No radiopaque foreign body. Lung apices are clear. Thyroid grossly normal.  IMPRESSION: No acute cervical spine abnormality.   Electronically Signed   By: Christiana Pellant M.D.   On: 04/19/2015 22:26   I have personally reviewed and evaluated these images and lab results as part of my medical decision-making.   EKG Interpretation None      MDM   Final diagnoses:  Cervical radiculopathy    Pt is well appearing, no focal neuro deficits.  CT scan of the C spine is neg.  Pt agrees to symptomatic tx and close PMD f/u.  Referral info given.  He appears stable for d/c and agrees to return here for any worsening sx's.    Pauline Aus, PA-C 04/21/15 1715  Samuel Jester, DO 04/23/15 [phone]

## 2015-05-26 ENCOUNTER — Ambulatory Visit: Payer: Self-pay | Admitting: Family Medicine

## 2015-05-27 ENCOUNTER — Ambulatory Visit (INDEPENDENT_AMBULATORY_CARE_PROVIDER_SITE_OTHER): Payer: Medicaid Other | Admitting: Family Medicine

## 2015-05-27 ENCOUNTER — Encounter: Payer: Medicaid Other | Admitting: Family Medicine

## 2015-05-27 ENCOUNTER — Encounter: Payer: Self-pay | Admitting: Family Medicine

## 2015-05-27 VITALS — BP 122/77 | HR 78 | Temp 98.6°F | Ht 67.0 in | Wt 145.8 lb

## 2015-05-27 DIAGNOSIS — F411 Generalized anxiety disorder: Secondary | ICD-10-CM | POA: Insufficient documentation

## 2015-05-27 MED ORDER — ESCITALOPRAM OXALATE 10 MG PO TABS: 10.0000 mg | ORAL_TABLET | Freq: Every day | ORAL | Status: DC

## 2015-05-27 MED ORDER — DIAZEPAM 5 MG PO TABS: 5.0000 mg | ORAL_TABLET | Freq: Two times a day (BID) | ORAL | Status: DC | PRN

## 2015-05-27 NOTE — Progress Notes (Signed)
   HPI  Patient presents today to establish care and discuss anxiety.  He states that due to some complicated family events lately he has had several panic attacks lately. He states that his ex-wife has taken his children and that he is in the last 2 weeks looking for them. He recently found them and has been working for an emergency custody situation.  He was in [facility] hospital a few days ago and given hydroxyzine to treat anxiety. He states that he had a panic attack characterized by chest pain, bad taste in his mouth, and arm pain. He states that usually last 15-30 minutes but this one lasted 2 hours.  He feels well currently but had another panic attack earlier today. He states they're happening nearly daily at this time.  Tried Zoloft, BuSpar, and Klonopin previously did not like the way Klonopin made him feel. He understands that Valium prescriptions will be limited  PMH: Smoking status noted His past medical, surgical, social, and family history reviewed and updated in EMR ROS: Per HPI  Objective: BP 122/77 mmHg  Pulse 78  Temp(Src) 98.6 F (37 C) (Oral)  Ht  (1.702 m)  Wt 145 lb 12.8 oz (66.134 kg)  BMI 22.83 kg/m2 Gen: NAD, alert, cooperative with exam HEENT: NCAT CV: RRR, good S1/S2, no murmur Resp: CTABL, no wheezes, non-labored Ext: No edema, warm Neuro: Alert and oriented, No gross deficits Psych: No suicidal ideation  Mood screening tools:  PHQ-2- 0, no SI GAD-7 Score: 21   Assessment and plan:  # Generalized anxiety disorder, panic disorder Treat with Lexapro, 5 mg and titrate up to 10 over 1 week Given 20 pills of 5 mg Valium, discussed that this will be the only benzodiazepine prescription F/u 2-4 weeks   Meds ordered this encounter  Medications  . escitalopram (LEXAPRO) 10 MG tablet    Sig: Take 1 tablet (10 mg total) by mouth daily.    Dispense:  30 tablet    Refill:  5  . diazepam (VALIUM) 5 MG tablet    Sig: Take 1 tablet (5 mg  total) by mouth every 12 (twelve) hours as needed for anxiety.    Dispense:  20 tablet    Refill:  0    Murtis Sink, MD [facility] 05/27/2015, 5:08 PM

## 2015-05-27 NOTE — Patient Instructions (Signed)
Great to meet you!  Come back in 2-4 weeks for a physical   Taking the medicine as directed and not missing any doses is one of the best things you can do to treat your anxiety.  Here are some things to keep in mind:  1) Side effects (stomach upset, some increased anxiety) may happen before you notice a benefit.  These side effects typically go away over time. 2) Changes to your dose of medicine or a change in medication all together is sometimes necessary 3) Most people need to be on medication at least 6-12 months 4) Many people will notice an improvement within two weeks but the full effect of the medication can take up to 4-6 weeks 5) Stopping the medication when you start feeling better often results in a return of symptoms 6) If you start having thoughts of hurting yourself or others after starting this medicine, please call me at [phone] immediately.   7)

## 2015-06-17 ENCOUNTER — Ambulatory Visit (INDEPENDENT_AMBULATORY_CARE_PROVIDER_SITE_OTHER): Payer: Medicaid Other | Admitting: Family Medicine

## 2015-06-17 ENCOUNTER — Ambulatory Visit: Payer: Medicaid Other | Admitting: Family Medicine

## 2015-06-17 ENCOUNTER — Encounter: Payer: Self-pay | Admitting: Family Medicine

## 2015-06-17 VITALS — BP 123/78 | HR 70 | Temp 97.1°F | Ht 67.0 in | Wt 150.8 lb

## 2015-06-17 DIAGNOSIS — Z0000 Encounter for general adult medical examination without abnormal findings: Secondary | ICD-10-CM | POA: Diagnosis not present

## 2015-06-17 DIAGNOSIS — F411 Generalized anxiety disorder: Secondary | ICD-10-CM | POA: Diagnosis not present

## 2015-06-17 MED ORDER — ESCITALOPRAM OXALATE 20 MG PO TABS: 20.0000 mg | ORAL_TABLET | Freq: Every day | ORAL | Status: DC

## 2015-06-17 MED ORDER — DIAZEPAM 5 MG PO TABS: 5.0000 mg | ORAL_TABLET | Freq: Two times a day (BID) | ORAL | Status: DC | PRN

## 2015-06-17 NOTE — Patient Instructions (Signed)
Great to see you again!  Go up to 2 pills daily of lexapro until you need a refill, I have sent new 20 mg pills.    Come back in 2 months  We will call with your lab results within 1 week, we will let you know if you need to do a fasting test.

## 2015-06-17 NOTE — Progress Notes (Signed)
   HPI  Patient presents today here for follow-up anxiety.  Patient is tolerating Lexapro easily Previously he felt that Klonopin and Xanax made him feel too strange to help his panic attacks, Valium is working easily. He has still been having a few panic attacks per week, however they're not as severe as the last one previous when we first met. He denies suicidal ideation He had slight dizziness the first 2 days of each dose which has resolved.  Healthcare maintenance He has positive family history of diabetes and is concerned about that. He has not fasting today  PMH: Smoking status noted ROS: Per HPI  Objective: BP 123/78 mmHg  Pulse 70  Temp(Src) 97.1 F (36.2 C) (Oral)  Ht _0  (1.702 m)  Wt 150 lb 12.8 oz (68.402 kg)  BMI 23.61 kg/m2 Gen: NAD, alert, cooperative with exam HEENT: NCAT Ext: No edema, warm Neuro: Alert and oriented, No gross deficits Psych: No SI, appropriate mood and affect   Assessment and plan:  # GERD Tolerating Lexapro well, maximized 20 mg Refilled Valium 1, set limits F/u 2 months   # HCM Non fasting labs today HIV and Hep C as well    Orders Placed This Encounter  Procedures  . CMP14+EGFR  . Lipid Panel  . CBC  . HIV antibody  . Hepatitis C antibody    Meds ordered this encounter  Medications  . escitalopram (LEXAPRO) 20 MG tablet    Sig: Take 1 tablet (20 mg total) by mouth daily.    Dispense:  30 tablet    Refill:  3  . diazepam (VALIUM) 5 MG tablet    Sig: Take 1 tablet (5 mg total) by mouth every 12 (twelve) hours as needed for anxiety.    Dispense:  20 tablet    Refill:  0    Laroy Apple, MD [facility] 06/17/2015, 8:14 AM

## 2015-06-18 LAB — CMP14+EGFR
ALT: 12 IU/L (ref 0–44)
AST: 19 IU/L (ref 0–40)
Albumin/Globulin Ratio: 2 (ref 1.1–2.5)
Albumin: 4.3 g/dL (ref 3.5–5.5)
Alkaline Phosphatase: 57 IU/L (ref 39–117)
BUN/Creatinine Ratio: 11 (ref 8–19)
BUN: 11 mg/dL (ref 6–20)
Bilirubin Total: 0.3 mg/dL (ref 0.0–1.2)
CO2: 20 mmol/L (ref 18–29)
Calcium: 9.5 mg/dL (ref 8.7–10.2)
Chloride: 103 mmol/L (ref 97–106)
Creatinine, Ser: 0.98 mg/dL (ref 0.76–1.27)
GFR calc Af Amer: 117 mL/min/{1.73_m2} (ref >59)
GFR calc non Af Amer: 101 mL/min/{1.73_m2} (ref >59)
Globulin, Total: 2.2 g/dL (ref 1.5–4.5)
Glucose: 145 mg/dL — ABNORMAL HIGH (ref 65–99)
Potassium: 4.8 mmol/L (ref 3.5–5.2)
Sodium: 141 mmol/L (ref 136–144)
Total Protein: 6.5 g/dL (ref 6.0–8.5)

## 2015-06-18 LAB — CBC
Hematocrit: 44.4 % (ref 37.5–51.0)
Hemoglobin: 15 g/dL (ref 12.6–17.7)
MCH: 31 pg (ref 26.6–33.0)
MCHC: 33.8 g/dL (ref 31.5–35.7)
MCV: 92 fL (ref 79–97)
Platelets: 315 10*3/uL (ref 150–379)
RBC: 4.84 x10E6/uL (ref 4.14–5.80)
RDW: 12.7 % (ref 12.3–15.4)
WBC: 7.2 10*3/uL (ref 3.4–10.8)

## 2015-06-18 LAB — LIPID PANEL
Chol/HDL Ratio: 2.3 ratio units (ref 0.0–5.0)
Cholesterol, Total: 122 mg/dL (ref 100–199)
HDL: 54 mg/dL (ref >39)
LDL Calculated: 55 mg/dL (ref 0–99)
Triglycerides: 66 mg/dL (ref 0–149)
VLDL Cholesterol Cal: 13 mg/dL (ref 5–40)

## 2015-06-18 LAB — HIV ANTIBODY (ROUTINE TESTING W REFLEX): HIV Screen 4th Generation wRfx: NONREACTIVE

## 2015-06-18 LAB — HEPATITIS C ANTIBODY: Hep C Virus Ab: 0.2 s/co ratio (ref 0.0–0.9)

## 2015-06-27 ENCOUNTER — Telehealth: Payer: Self-pay | Admitting: Family Medicine

## 2015-06-27 NOTE — Telephone Encounter (Signed)
Stp and reviewed lab results. 

## 2015-08-04 ENCOUNTER — Telehealth: Payer: Self-pay | Admitting: Family Medicine

## 2015-08-04 ENCOUNTER — Encounter: Payer: Self-pay | Admitting: Family Medicine

## 2015-08-04 ENCOUNTER — Ambulatory Visit: Payer: Medicaid Other | Admitting: Pediatrics

## 2015-08-04 ENCOUNTER — Ambulatory Visit (INDEPENDENT_AMBULATORY_CARE_PROVIDER_SITE_OTHER): Payer: Medicaid Other | Admitting: Family Medicine

## 2015-08-04 VITALS — BP 120/74 | HR 81 | Temp 97.5°F | Ht 67.0 in | Wt 146.0 lb

## 2015-08-04 DIAGNOSIS — F411 Generalized anxiety disorder: Secondary | ICD-10-CM | POA: Diagnosis not present

## 2015-08-04 MED ORDER — VENLAFAXINE HCL ER 75 MG PO CP24: 75.0000 mg | ORAL_CAPSULE | Freq: Every day | ORAL | Status: DC

## 2015-08-04 NOTE — Assessment & Plan Note (Signed)
Feels that Lexapro is not working, we will try Effexor

## 2015-08-04 NOTE — Progress Notes (Signed)
BP 120/74 mmHg  Pulse 81  Temp(Src) 97.5 F (36.4 C) (Oral)  Ht  (1.702 m)  Wt 146 lb (66.225 kg)  BMI 22.86 kg/m2   Subjective:    Patient ID: Raymond Mckinney, male    DOB: 1980/11/28, 34 y.o.   MRN: [id number]  HPI: Raymond Mckinney is a 34 y.o. male presenting on 08/04/2015 for Panic attacks, anxiety   HPI Anxiety follow-up  Patient presents today for an anxiety follow-up. He usually sees Dr. Ermalinda Memos for this. Dr. Ermalinda Memos results shows that he has given him the Valium but only meant to be for short-term until he can get him up on a medication that was working. Patient felt like the Lexapro is working temporarily but not anymore. He denies any suicidal ideations or thoughts of death. He does admit that he gets panic attacks and a been happening more frequently. He is concerned about losing his job. We discussed coping skills and using methods to cool down.  Relevant past medical, surgical, family and social history reviewed and updated as indicated. Interim medical history since our last visit reviewed. Allergies and medications reviewed and updated.  Review of Systems  Constitutional: Negative for fever and chills.  HENT: Negative for ear discharge and ear pain.   Eyes: Negative for discharge and visual disturbance.  Respiratory: Negative for shortness of breath and wheezing.   Cardiovascular: Negative for chest pain and leg swelling.  Gastrointestinal: Negative for abdominal pain, diarrhea and constipation.  Genitourinary: Negative for difficulty urinating.  Musculoskeletal: Negative for back pain and gait problem.  Skin: Negative for rash.  Neurological: Negative for syncope, light-headedness and headaches.  Psychiatric/Behavioral: Positive for sleep disturbance and dysphoric mood. Negative for suicidal ideas, confusion, self-injury and agitation. The patient is nervous/anxious.   All other systems reviewed and are negative.   Per HPI unless specifically indicated  above     Medication List       This list is accurate as of: 08/04/15  1:20 PM.  Always use your most recent med list.               diazepam 5 MG tablet  Commonly known as:  VALIUM  Take 1 tablet (5 mg total) by mouth every 12 (twelve) hours as needed for anxiety.     venlafaxine XR 75 MG 24 hr capsule  Commonly known as:  EFFEXOR XR  Take 1 capsule (75 mg total) by mouth daily with breakfast.           Objective:    BP 120/74 mmHg  Pulse 81  Temp(Src) 97.5 F (36.4 C) (Oral)  Ht  (1.702 m)  Wt 146 lb (66.225 kg)  BMI 22.86 kg/m2  Wt Readings from Last 3 Encounters:  08/04/15 146 lb (66.225 kg)  06/17/15 150 lb 12.8 oz (68.402 kg)  05/27/15 145 lb 12.8 oz (66.134 kg)    Physical Exam  Constitutional: He is oriented to person, place, and time. He appears well-developed and well-nourished. No distress.  Eyes: Conjunctivae and EOM are normal. Pupils are equal, round, and reactive to light. Right eye exhibits no discharge. No scleral icterus.  Neck: Neck supple. No thyromegaly present.  Cardiovascular: Normal rate, regular rhythm, normal heart sounds and intact distal pulses.   No murmur heard. Pulmonary/Chest: Effort normal and breath sounds normal. No respiratory distress. He has no wheezes.  Musculoskeletal: Normal range of motion. He exhibits no edema.  Lymphadenopathy:    He has no cervical adenopathy.  Neurological: He is alert and oriented to person, place, and time. Coordination normal.  Skin: Skin is warm and dry. No rash noted. He is not diaphoretic.  Psychiatric: His speech is normal and behavior is normal. Judgment and thought content normal. His mood appears anxious. His affect is angry and blunt. Cognition and memory are normal. He exhibits a depressed mood. He expresses no suicidal ideation. He expresses no suicidal plans.  Vitals reviewed.       Assessment & Plan:   Problem List Items Addressed This Visit      Other   GAD (generalized  anxiety disorder) - Primary    Feels that Lexapro is not working, we will try Effexor      Relevant Medications   venlafaxine XR (EFFEXOR XR) 75 MG 24 hr capsule       Follow up plan: Return in about 4 weeks (around 09/01/2015), or if symptoms worsen or fail to improve, for F/u Anxiety.  Counseling provided for all of the vaccine components No orders of the defined types were placed in this encounter.    Arville CareJoshua Brezlyn Manrique, MD [facility] 08/04/2015, 1:20 PM

## 2015-08-04 NOTE — Telephone Encounter (Signed)
Definitely needs to be seen.   Raymond SinkSam Jazion Atteberry, MD [facility] 08/04/2015, 9:58 AM

## 2015-08-04 NOTE — Telephone Encounter (Signed)
Pt has appt today at 1:10.

## 2015-08-21 DIAGNOSIS — S069XAA Unspecified intracranial injury with loss of consciousness status unknown, initial encounter: Secondary | ICD-10-CM

## 2015-08-21 DIAGNOSIS — S069X9A Unspecified intracranial injury with loss of consciousness of unspecified duration, initial encounter: Secondary | ICD-10-CM

## 2015-08-21 HISTORY — DX: Unspecified intracranial injury with loss of consciousness status unknown, initial encounter: S06.9XAA

## 2015-08-21 HISTORY — DX: Unspecified intracranial injury with loss of consciousness of unspecified duration, initial encounter: S06.9X9A

## 2015-08-23 ENCOUNTER — Ambulatory Visit: Payer: Medicaid Other | Admitting: Family Medicine

## 2015-09-01 ENCOUNTER — Ambulatory Visit: Payer: Medicaid Other | Admitting: Family Medicine

## 2015-09-02 ENCOUNTER — Encounter: Payer: Self-pay | Admitting: Family Medicine
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
## 2017-01-28 IMAGING — CT CT CERVICAL SPINE W/O CM
3 of 4 series · 10 of 33 positions shown, 12 images · non-contrast
Comparison: CT angio neck 09/08/2014

CLINICAL DATA: Left neck tingling

EXAM:
CT CERVICAL SPINE WITHOUT CONTRAST
TECHNIQUE: Multidetector CT imaging of the cervical spine was performed without
intravenous contrast. Multiplanar CT image reconstructions were also
generated.

[Series 3: cervical 2.0 st axial · axial · 0.31mm/px · z∈[+132,+200]mm · 2 of 104 slices shown, 3 images]
[im 35/104  soft-tissue]
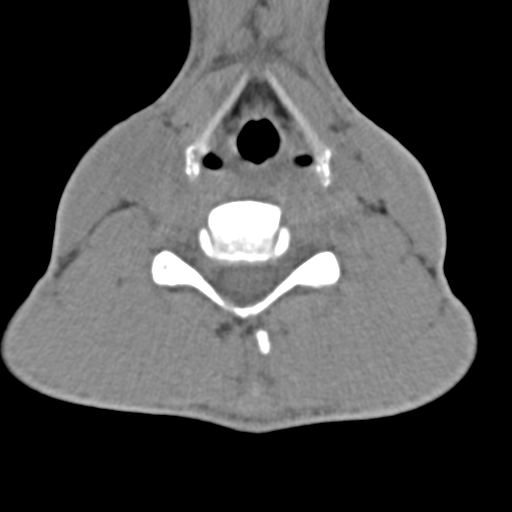
[im 35/104  bone]
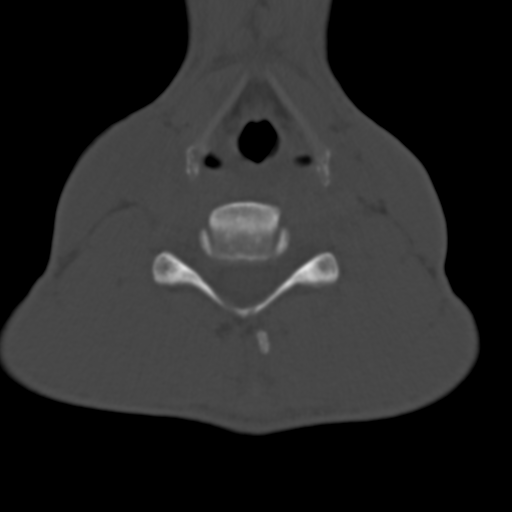
[im 69/104  bone]
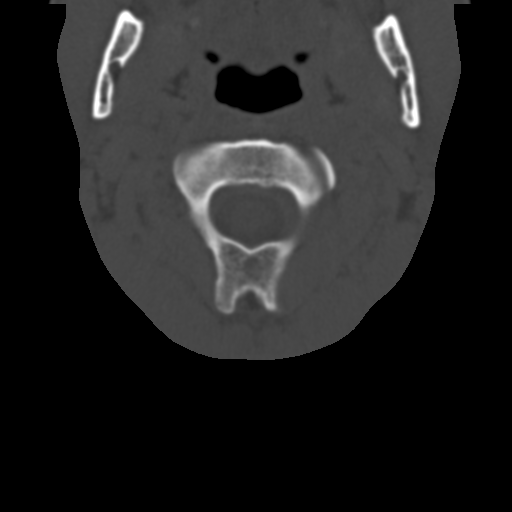

[Series 8: cervical spine coronal bone · coronal · 0.19mm/px · 3 of 60 slices shown]
[im 12/60  bone]
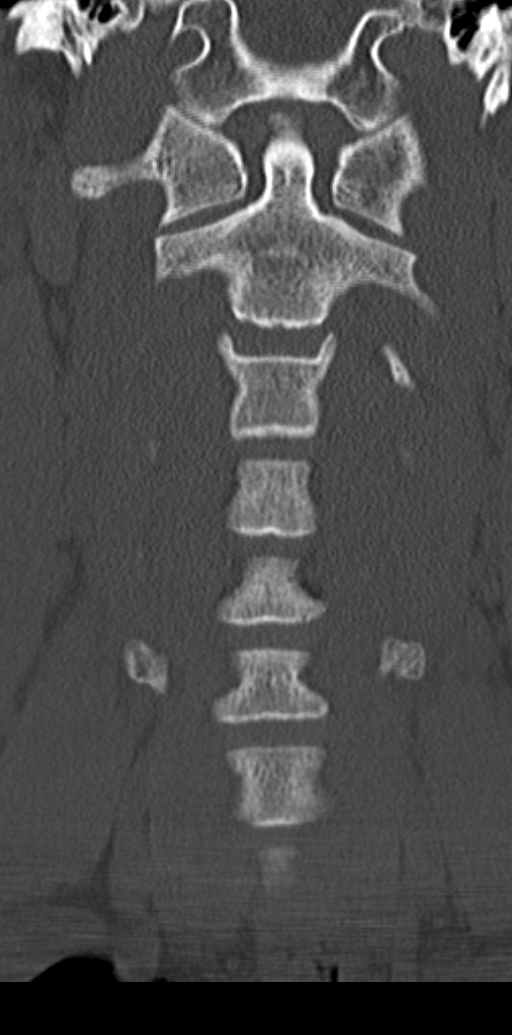
[im 24/60  bone]
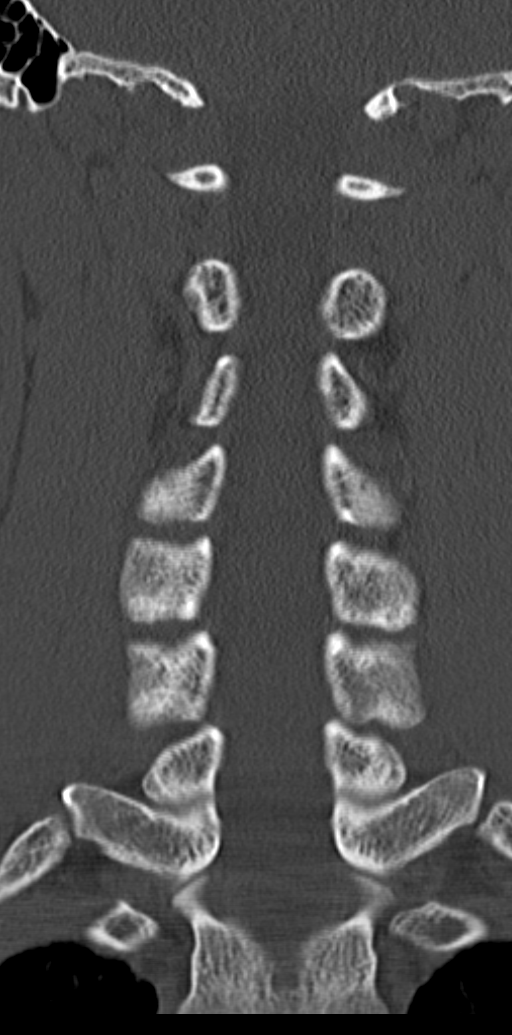
[im 36/60  bone]
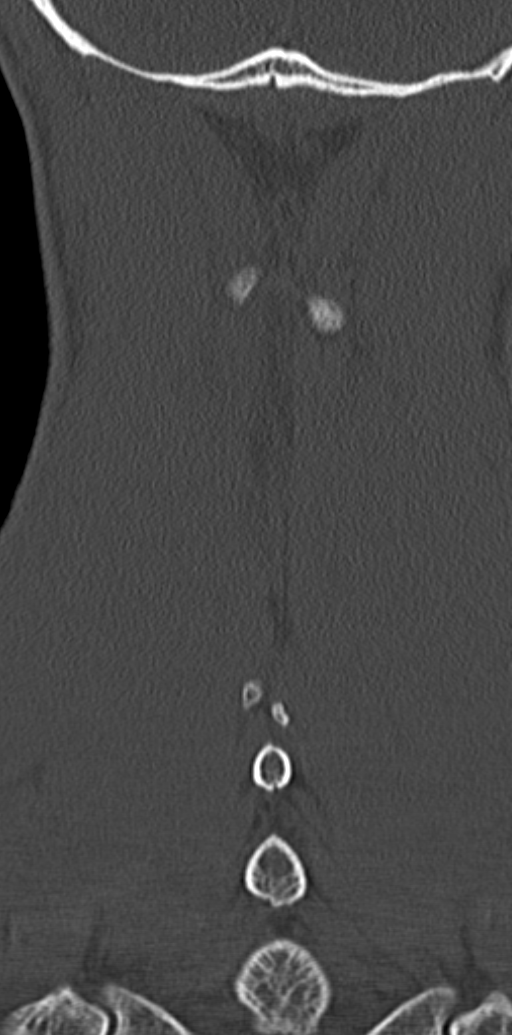

[Series 9: cervical spine sagittal bone · sagittal · 0.23mm/px · 5 of 54 slices shown, 6 images]
[im 18/54  bone]
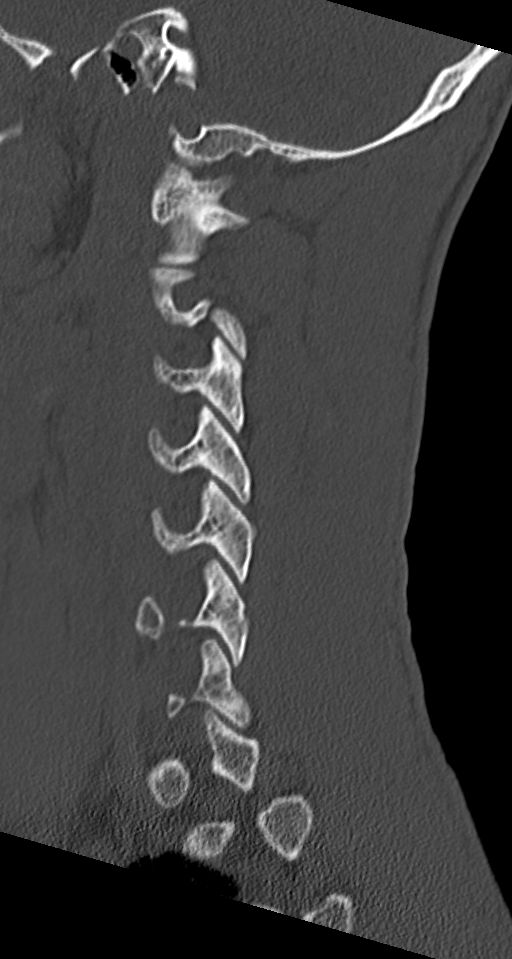
[im 23/54  bone]
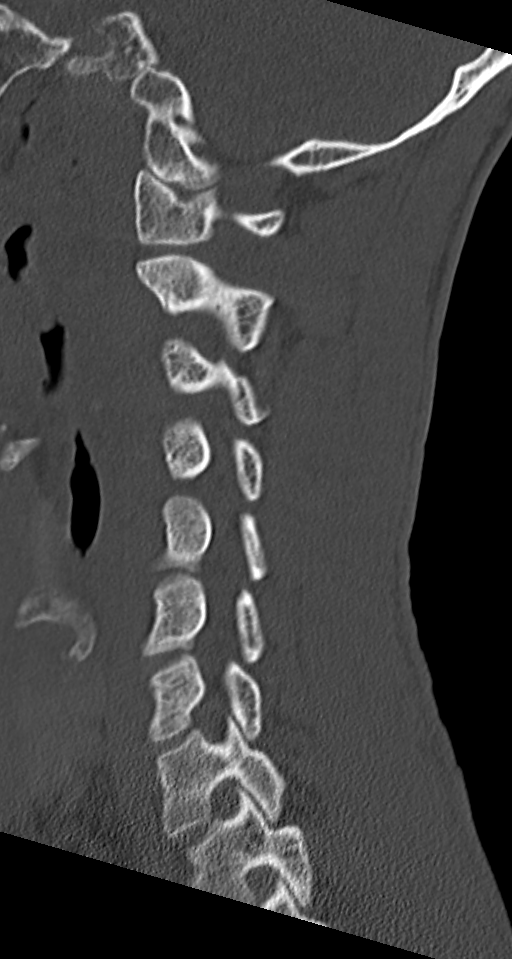
[im 27/54  soft-tissue]
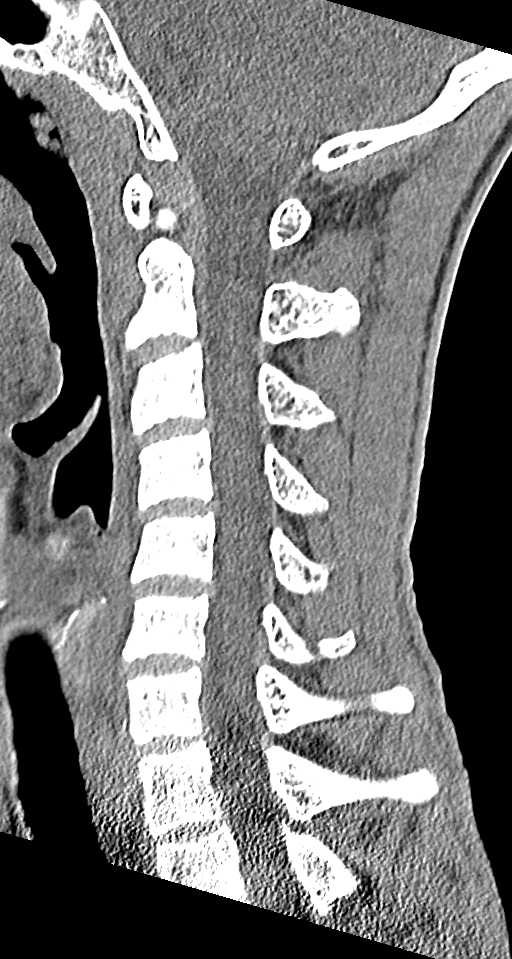
[im 27/54  bone]
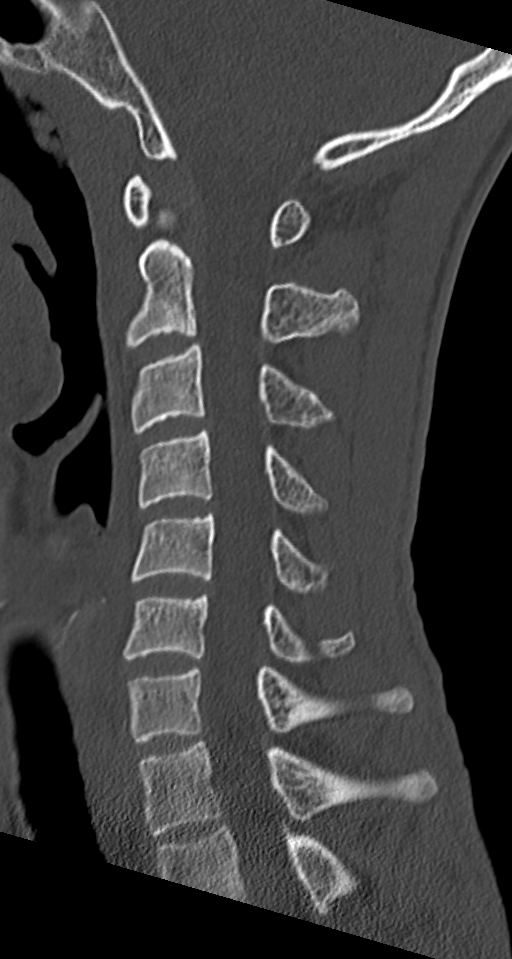
[im 31/54  bone]
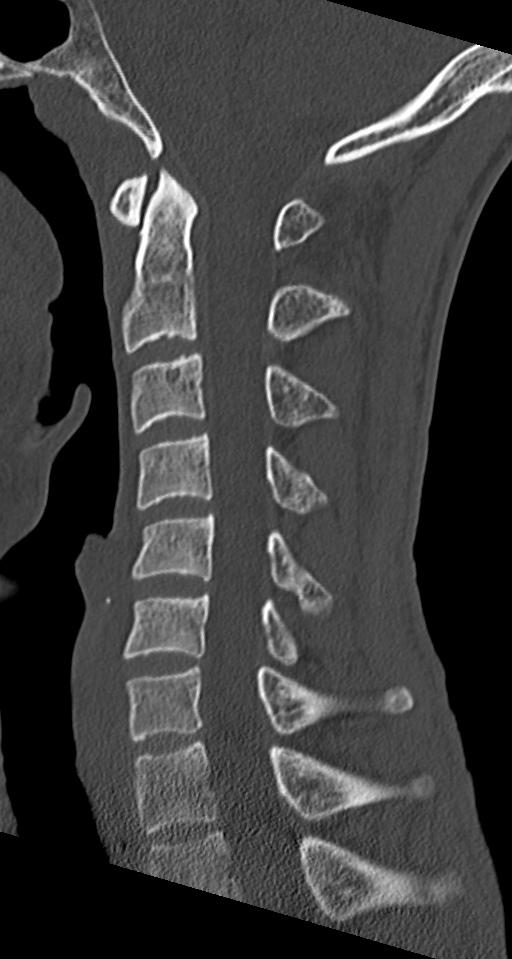
[im 36/54  bone]
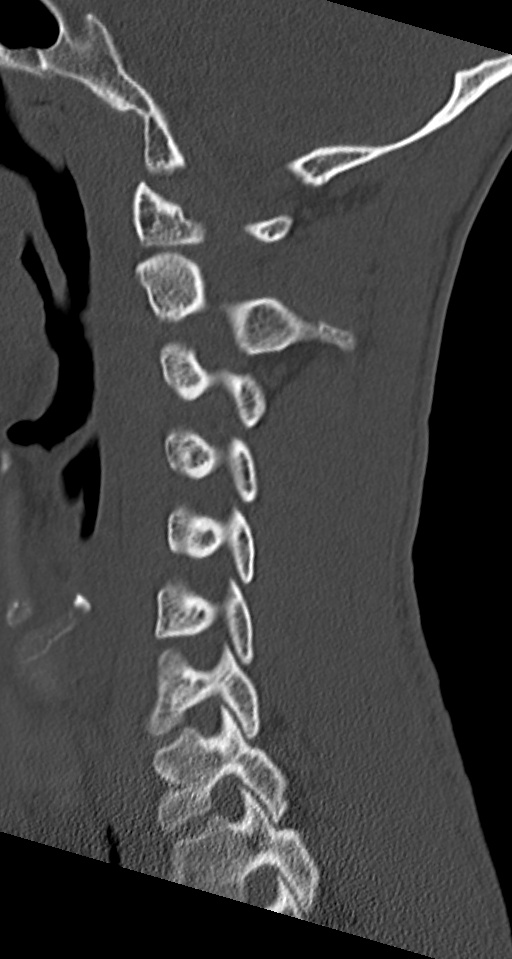

[10 of 33 positions shown; findings below may reference images not displayed]

FINDINGS: Mild reversal of the normal cervical lordosis at C5. Vertebral body
heights and intervertebral disc spaces are maintained. No fracture
or dislocation. No radiopaque foreign body. Lung apices are clear.
Thyroid grossly normal.
IMPRESSION: No acute cervical spine abnormality.

## 2018-09-25 ENCOUNTER — Other Ambulatory Visit: Payer: Self-pay | Admitting: Orthopedic Surgery

## 2018-10-08 ENCOUNTER — Encounter: Payer: Self-pay | Admitting: *Deleted

## 2018-10-09 ENCOUNTER — Telehealth: Payer: Self-pay | Admitting: *Deleted

## 2018-10-09 NOTE — Telephone Encounter (Signed)
Spoke with patient and advised him I need to reschedule appt tomorrow due to bad weather. He stated he was going to call and reschedule himself. Rescheduled for next Tues. He verbalized understanding, appreciation.

## 2018-10-10 ENCOUNTER — Ambulatory Visit: Payer: Self-pay | Admitting: Diagnostic Neuroimaging

## 2018-10-14 ENCOUNTER — Encounter: Payer: Self-pay | Admitting: Diagnostic Neuroimaging

## 2018-10-14 ENCOUNTER — Ambulatory Visit: Payer: Self-pay | Admitting: Diagnostic Neuroimaging

## 2018-10-14 VITALS — BP 124/75 | HR 97 | Ht 67.0 in | Wt 158.4 lb

## 2018-10-14 DIAGNOSIS — S064X1S Epidural hemorrhage with loss of consciousness of 30 minutes or less, sequela: Secondary | ICD-10-CM

## 2018-10-14 DIAGNOSIS — G5601 Carpal tunnel syndrome, right upper limb: Secondary | ICD-10-CM

## 2018-10-14 NOTE — Patient Instructions (Signed)
-   I reviewed notes from 2017 [facility] admission; no seizure occurred. Patient was on anti-sz med as prevention from 2017 - 2018; now off meds and doing well  - no neurologic contraindication for anticipated RIGHT carpal tunnel release surgery
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
## 2018-10-15 ENCOUNTER — Encounter: Payer: Self-pay | Admitting: Diagnostic Neuroimaging

## 2020-08-21 ENCOUNTER — Encounter (HOSPITAL_COMMUNITY): Payer: Self-pay | Admitting: Emergency Medicine

## 2020-08-21 ENCOUNTER — Other Ambulatory Visit: Payer: Self-pay

## 2020-08-21 ENCOUNTER — Emergency Department (HOSPITAL_COMMUNITY)
Admission: EM | Admit: 2020-08-21 | Discharge: 2020-08-22 | Disposition: A | Discharge status: Home / Self Care | Payer: Self-pay | Attending: Emergency Medicine | Admitting: Emergency Medicine

## 2020-08-21 DIAGNOSIS — F1510 Other stimulant abuse, uncomplicated: Secondary | ICD-10-CM

## 2020-08-21 DIAGNOSIS — F22 Delusional disorders: Secondary | ICD-10-CM | POA: Insufficient documentation

## 2020-08-21 DIAGNOSIS — Z20822 Contact with and (suspected) exposure to covid-19: Secondary | ICD-10-CM | POA: Insufficient documentation

## 2020-08-21 DIAGNOSIS — F1511 Other stimulant abuse, in remission: Secondary | ICD-10-CM

## 2020-08-21 DIAGNOSIS — F411 Generalized anxiety disorder: Secondary | ICD-10-CM | POA: Insufficient documentation

## 2020-08-21 DIAGNOSIS — F17210 Nicotine dependence, cigarettes, uncomplicated: Secondary | ICD-10-CM | POA: Insufficient documentation

## 2020-08-21 DIAGNOSIS — I10 Essential (primary) hypertension: Secondary | ICD-10-CM | POA: Insufficient documentation

## 2020-08-21 DIAGNOSIS — F1520 Other stimulant dependence, uncomplicated: Secondary | ICD-10-CM | POA: Insufficient documentation

## 2020-08-21 LAB — RESP PANEL BY RT-PCR (FLU A&B, COVID) ARPGX2
Influenza A by PCR: NEGATIVE
Influenza B by PCR: NEGATIVE
SARS Coronavirus 2 by RT PCR: NEGATIVE

## 2020-08-21 MED ORDER — LORAZEPAM 1 MG PO TABS
1.0000 mg | ORAL_TABLET | Freq: Once | ORAL | Status: AC
  Administered 2020-08-21: 1 mg via ORAL
  Filled 2020-08-21: qty 1

## 2020-08-21 MED ORDER — ZOLPIDEM TARTRATE 5 MG PO TABS
5.0000 mg | ORAL_TABLET | Freq: Once | ORAL | Status: AC
  Administered 2020-08-21: 5 mg via ORAL
  Filled 2020-08-21: qty 1

## 2020-08-21 MED ORDER — NICOTINE 21 MG/24HR TD PT24
21.0000 mg | MEDICATED_PATCH | Freq: Once | TRANSDERMAL | Status: DC
  Administered 2020-08-21: 21 mg via TRANSDERMAL
  Filled 2020-08-21: qty 1

## 2020-08-21 NOTE — ED Notes (Signed)

## 2020-08-21 NOTE — Consult Note (Signed)
Telepsych Consultation   Reason for Consult:  Psychiatry consult Referring Physician:  Dr Rubin Payor Location of Patient:  [facility] Location of Provider: Behavioral Health TTS Department  Patient Identification: Raymond Mckinney MRN:  [id number] Principal Diagnosis: Methamphetamine use disorder, severe (HCC) Diagnosis:  Principal Problem:   Methamphetamine use disorder, severe (HCC)   Total Time spent with patient: 20 minutes  Subjective:   Raymond Mckinney is a 40 y.o. male patient.  Patient states "I really want to get help, this way in her life to live."  Patient reports he has made suicidal statements but has no plan or intent to harm himself.  Patient states "I just want to get help."  Patient denies suicidal ideations currently.  Patient contracts verbally for safety with this Clinical research associate.  Patient endorses 1 prior suicide attempt "a few years ago."  HPI: Patient presents voluntarily to [facility] emergency department request help with methamphetamine use disorder.  Patient reports after 9 months sobriety he relapsed on methamphetamine 2 months ago.  Patient reports he would like inpatient substance use treatment.  Patient denies any history of substance use treatment in the past.  Patient resides in [place].  Patient denies access to weapons.  Patient reports he is currently employed.  Patient denies substance use aside from methamphetamine.  Patient denies outpatient psychiatric treatment.  Per medical record patient has history of generalized anxiety disorder.  Patient denies any current home medications.  Patient reports he would like to follow-up with outpatient psychiatry once he has completed treatment for his substance use disorder.  Patient assessed by nurse practitioner.  Patient alert and oriented, answers appropriately.  Patient pleasant cooperative during assessment.  Patient denies homicidal ideations.  Patient denies auditory and visual hallucinations  today.  There is no evidence of delusional thought content and no indication that patient is responding to internal stimuli.  Patient reports instances of paranoia.  Patient reports this happens [place] years [place].  Patient offered support and encouragement.  Patient gives TTS counselor consent to speak with his mother for collateral information.  Past Psychiatric History: Generalized anxiety disorder  Risk to Self:  Denies Risk to Others:  Denies Prior Inpatient Therapy:  Denies Prior Outpatient Therapy:  Denies  Past Medical History:  Past Medical History:  Diagnosis Date  . Anxiety   . Brain injury (HCC) 08/2015  . Depression   . Hypertension   . Seizures (HCC)    08/2015    Past Surgical History:  Procedure Laterality Date  . HAND SURGERY    . OTHER SURGICAL HISTORY Left Hand surgery   Family History:  Family History  Problem Relation Age of Onset  . Dementia Father   . Diabetes Sister    Family Psychiatric  History: None reported Social History:  Social History   Substance and Sexual Activity  Alcohol Use Not Currently     Social History   Substance and Sexual Activity  Drug Use Yes  . Types: Marijuana, Methamphetamines   Comment: meth last night- MJ once a month    Social History   Socioeconomic History  . Marital status: Married    Spouse name: Not on file  . Number of children: 5  . Years of education: GED  . Highest education level: Not on file  Occupational History    Comment: [employer]  Tobacco Use  . Smoking status: Current Some Day Smoker    Packs/day: 1.50  Types: Cigarettes  . Smokeless tobacco: Never Used  Vaping Use  . Vaping Use: Never used  Substance and Sexual Activity  . Alcohol use: Not Currently  . Drug use: Yes    Types: Marijuana, Methamphetamines    Comment: meth last night- MJ once a month  . Sexual activity: Not on file  Other Topics Concern  . Not on file   Social History Narrative   Lives with family   Caffeine- Mtn Dew once a day, lots of tea   Social Determinants of Health   Financial Resource Strain: Not on file  Food Insecurity: Not on file  Transportation Needs: Not on file  Physical Activity: Not on file  Stress: Not on file  Social Connections: Not on file   Additional Social History:    Allergies:   Allergies  Allergen Reactions  . Corticosteroids Shortness Of Breath    Pt has had 2 cortisone injections with no issues  . Cortizone-5 [Hydrocortisone Base] Anaphylaxis  . Shellfish Allergy Anaphylaxis and Hives  . Penicillins   . Penicillins Other (See Comments)    As child    Labs:  Results for orders placed or performed during the hospital encounter of 08/21/20 (from the past 48 hour(s))  Resp Panel by RT-PCR (Flu A&B, Covid) Nasopharyngeal Swab     Status: None   Collection Time: 08/21/20  3:43 PM   Specimen: Nasopharyngeal Swab; Nasopharyngeal(NP) swabs in vial transport medium  Result Value Ref Range   SARS Coronavirus 2 by RT PCR NEGATIVE NEGATIVE    Comment: (NOTE) SARS-CoV-2 target nucleic acids are NOT DETECTED.  The SARS-CoV-2 RNA is generally detectable in upper respiratory specimens during the acute phase of infection. The lowest concentration of SARS-CoV-2 viral copies this assay can detect is 138 copies/mL. A negative result does not preclude SARS-Cov-2 infection and should not be used as the sole basis for treatment or other patient management decisions. A negative result may occur with  improper specimen collection/handling, submission of specimen other than nasopharyngeal swab, presence of viral mutation(s) within the areas targeted by this assay, and inadequate number of viral copies(<138 copies/mL). A negative result must be combined with clinical observations, patient history, and epidemiological information. The expected result is Negative.  Fact Sheet for Patients:   EntrepreneurPulse.com.au  Fact Sheet for Healthcare Providers:  IncredibleEmployment.be  This test is no t yet approved or cleared by the [place] FDA and  has been authorized for detection and/or diagnosis of SARS-CoV-2 by FDA under an Emergency Use Authorization (EUA). This EUA will remain  in effect (meaning this test can be used) for the duration of the COVID-19 declaration under Section 564(b)(1) of the Act, 21 U.S.C.section 360bbb-3(b)(1), unless the authorization is terminated  or revoked sooner.       Influenza A by PCR NEGATIVE NEGATIVE   Influenza B by PCR NEGATIVE NEGATIVE    Comment: (NOTE) The Xpert Xpress SARS-CoV-2/FLU/RSV plus assay is intended as an aid in the diagnosis of influenza from Nasopharyngeal swab specimens and should not be used as a sole basis for treatment. Nasal washings and aspirates are unacceptable for Xpert Xpress SARS-CoV-2/FLU/RSV testing.  Fact Sheet for Patients: EntrepreneurPulse.com.au  Fact Sheet for Healthcare Providers: IncredibleEmployment.be  This test is not yet approved or cleared by the [place] FDA and has been authorized for detection and/or diagnosis of SARS-CoV-2 by FDA under an Emergency Use Authorization (EUA). This EUA will remain in effect (meaning this test can be used) for the duration of  the COVID-19 declaration under Section 564(b)(1) of the Act, 21 U.S.C. section 360bbb-3(b)(1), unless the authorization is terminated or revoked.  Performed at [facility], [address], [place], [place] [address]     Medications:  No current facility-administered medications for this encounter.   Current Outpatient Medications  Medication Sig Dispense Refill  . Aspirin-Salicylamide-Caffeine (BC HEADACHE POWDER PO) Take by mouth as needed.      Musculoskeletal: Strength & Muscle Tone: within normal limits Gait & Station: normal Patient  leans: N/A  Psychiatric Specialty Exam: Physical Exam Vitals and nursing note reviewed.  Constitutional:      Appearance: He is well-developed.  HENT:     Head: Normocephalic.  Cardiovascular:     Rate and Rhythm: Normal rate.  Pulmonary:     Effort: Pulmonary effort is normal.  Neurological:     Mental Status: He is alert and oriented to person, place, and time.  Psychiatric:        Attention and Perception: Attention and perception normal.        Mood and Affect: Mood and affect normal.        Speech: Speech normal.        Behavior: Behavior normal. Behavior is cooperative.        Thought Content: Thought content normal.        Cognition and Memory: Cognition and memory normal.        Judgment: Judgment normal.     Review of Systems  Constitutional: Negative.   HENT: Negative.   Eyes: Negative.   Respiratory: Negative.   Cardiovascular: Negative.   Gastrointestinal: Negative.   Genitourinary: Negative.   Musculoskeletal: Negative.   Skin: Negative.   Neurological: Negative.   Psychiatric/Behavioral: Negative.     Blood pressure 118/84, pulse 99, temperature 98.4 F (36.9 C), temperature source Oral, resp. rate 17, height 5\' 7"  (1.702 m), weight 67.4 kg, SpO2 100 %.Body mass index is 23.27 kg/m.  General Appearance: Casual and Fairly Groomed  Eye Contact:  Good  Speech:  Clear and Coherent and Normal Rate  Volume:  Normal  Mood:  Euthymic  Affect:  Appropriate and Congruent  Thought Process:  Coherent, Goal Directed and Descriptions of Associations: Intact  Orientation:  Full (Time, Place, and Person)  Thought Content:  Logical  Suicidal Thoughts:  No  Homicidal Thoughts:  No  Memory:  Immediate;   Good Recent;   Good Remote;   Good  Judgement:  Fair  Insight:  Good  Psychomotor Activity:  Normal  Concentration:  Concentration: Good and Attention Span: Good  Recall:  Good  Fund of Knowledge:  Good  Language:  Good  Akathisia:  No  Handed:  Right  AIMS  (if indicated):     Assets:  Communication Skills Desire for Improvement Financial Resources/Insurance Housing Intimacy Leisure Time Physical Health Resilience Social Support Talents/Skills Transportation Vocational/Educational  ADL's:  Intact  Cognition:  WNL  Sleep:        Treatment Plan Summary: Patient reviewed with Dr. . Follow-up with outpatient substance use treatment resources provided. Follow-up with outpatient psychiatry.  Disposition: No evidence of imminent risk to self or others at present.   Patient does not meet criteria for psychiatric inpatient admission. Supportive therapy provided about ongoing stressors. Discussed crisis plan, support from social network, calling 911, coming to the Emergency Department, and calling Suicide Hotline.  This service was provided via telemedicine using a 2-way, interactive audio and video technology.  Names of all persons participating in  this telemedicine service and their role in this encounter. Name: Ebbie Ridge Role: Patient  Name: Berneice Heinrich Role: F NP  Name: Melanie Crazier Role: Psychiatrist    Patrcia Dolly, FNP 08/21/2020 6:02 PM

## 2020-08-21 NOTE — ED Notes (Signed)
Provided pt with meal tray at bedside.

## 2020-08-21 NOTE — ED Notes (Signed)
EMS brought a pt. Down the hallway. This pt. Asked " Who is that? Are they FBI? They just took a photo of me." Notified pt. That the person with the phone is not with the FBI and that they were just talking on the phone. Was able to redirect pt. And calm pt. Down.

## 2020-08-21 NOTE — Discharge Instructions (Addendum)
Substance abuse resources and Residential Options:  [facility] day residential substance abuse facility (not an option if you have active assault charges). [address], [place], [place] [address] Phone: [phone]: Ask for Shayla in admissions to complete intake if interested in pursuing this option.  [facility]: Can get intake scheduled; (not an option if you have active assault charges). [address] [place], [place] ([phone]) Call Mon-Fri.  [facility]): (offers outpatient therapy and intensive outpatient substance abuse therapy).  [address], [place], [place] [address] Phone: [phone]  [facility]: Offers FREE recovery skills classes, support groups, 1:1 Peer Support, and Compeer Classes. [address], [place], [place] [address] Phone: [phone] (Call to complete intake).   [facility] [address] [place], [place] [address] Phone: [phone] ext [phone]  The [facility] provides food, shelter and other programs and services to the homeless men of [place] through our men's program.  By offering safe shelter, three meals a day, clean clothing, Biblical counseling, financial planning, vocational training, GED/education and employment assistance, we've helped mend the shattered lives of many homeless men since opening in 1974.  We have approximately 267 beds available, with a max of 312 beds including mats for emergency situations and currently house an average of 270 men a night.  Prospective Client Check-In Information Photo ID Required (State/ Out of State/ DOC) - if photo ID is not available, clients are required to have a printout of a police/sheriff's criminal history report. Help out with chores around the Mission. No sex offender of any type (pending, charged, registered and/or any other sex related offenses) will be permitted to check in. Must be  willing to abide by all rules, regulations, and policies established by the [facility]. The following will be provided - shelter, food, clothing, and biblical counseling. If you or someone you know is in need of assistance at our men's shelter in [place], [place], please call [phone] ext. [phone].  

## 2020-08-21 NOTE — BH Assessment (Addendum)
Comprehensive Clinical Assessment (CCA) Note  08/21/2020 Raymond Mckinney [id number]   Disposition:per Nira Retort, FNP Follow-up with outpatient substance use treatment resources provided. Follow-up with outpatient psychiatry.([facility])  Pt is a 40 yo male who presents voluntarily to [facility]  via car. Pt was accompanied by himself  reporting symptoms of Methamphetamine abuse and  anxiety . Pt has a history of substance abuse and says he was referred for assessment by himself after his AVH  has gotten worse over the past 2 months since his relapse on Meth. Pt denies any medication. .Pt denies suicidal ideation today . Patient reports he made suicide statements but has no plans or intent to harm himself. " I just want to get help ". Pt endorses 1 past attempt a few years ago. Pt acknowledges symptoms of anxiety and guilt after relapse. Pt. denies homicidal ideation/ history of violence. Pt /denies auditory & visual hallucinations or other symptoms of psychosis today. Pt endorses AVH / flashbacks of his arrest on 08/13/2014 by LE who were hiding at the barn across from his home. Pt states current stressors include relapse on Meth after 9 months of being clean .  Pt lives with wife and children and supports include wife, children and mother  . Pt denies a hx of abuse and trauma. Pt denies there is a family history of mental health. 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 Pt has good insight and judgment. Pt's memory is intact . Pt. denies any current legal history. Pt did 3 1/2 years in prison for Manufacturing Meth . Pt was released in 2019.  Protective factors against suicide include good family support, no current suicidal ideation, future orientation, therapeutic relationship, no access to firearms, no current psychotic symptoms and no prior attempts in over a year.  Pt's OP history includes  [facility] at age 49. IP history includes [facility] in [place] and a place in [place] .  Last admission was at [facility] at age 73.   Pt denies alcohol/ but reports Meth substance abuse.  MSE: Pt is casually dressed, alert, oriented x5 with normal speech and normal motor behavior. Eye contact is good. Pt's mood is depressed and affect is depressed and anxious. Affect is congruent with mood. Thought process is coherent and relevant. There is no indication Pt is currently responding to internal stimuli or experiencing delusional thought content. Pt was cooperative throughout assessment.   Collateral: LCSWA spoke with mother Truddie Coco [phone]).. Per Ms. Huntley her son has been abusing drugs for along time and really wants help .Mother stated that Meth has been the worst one.  LCSWA went over treatment plan options with mother who advised that she would love to see her son placed tonight but if he needed to wait until tomorrow she would be willing to drive him to [facility] to check in at [facility] . Mother stated she is older and needs the day light to see better when driving. Mother stated her son's wife went to her mother's in [place] with some of the children and she has the other children with her. Mother stated he wont hurt anybody he will be by himself at home .Mother thanked LCSWA for her update. No additional information to report.   Disposition:per Nira Retort, FNP Follow-up with outpatient substance use treatment resources provided. Follow-up with outpatient psychiatry.[facility])      Chief Complaint:  Chief Complaint  Patient presents with  . Paranoid   Visit Diagnosis:  Methamphetamine use disorder, severe (HCC   CCA Screening, Triage and Referral (STR)  Patient Reported Information How did you hear about [place]? Self  Referral name: No data recorded Referral phone number: No data recorded  Whom do you see for routine medical problems? Primary Care  Practice/Facility Name: Patient unable to remember  Practice/Facility Phone Number: No data recorded Name of  Contact: No data recorded Contact Number: No data recorded Contact Fax Number: No data recorded Prescriber Name: No data recorded Prescriber Address (if known): No data recorded  What Is the Reason for Your Visit/Call Today? Substance Abuse treatment  How Long Has This Been Causing You Problems? 1-6 months  What Do You Feel Would Help You the Most Today? Other (Comment) (Detox treatment / 28 day program)   Have You Recently Been in Any Inpatient Treatment (Hospital/Detox/Crisis Center/28-Day Program)? No  Name/Location of Program/Hospital:No data recorded How Long Were You There? No data recorded When Were You Discharged? No data recorded  Have You Ever Received Services From [facility] Before? No  Who Do You See at [facility]? No data recorded  Have You Recently Had Any Thoughts About Hurting Yourself? Yes  Are You Planning to Commit Suicide/Harm Yourself At This time? No   Have you Recently Had Thoughts About Hurting Someone Karolee Ohs? No  Explanation: No data recorded  Have You Used Any Alcohol or Drugs in the Past 24 Hours? Yes  How Long Ago Did You Use Drugs or Alcohol? 1200  What Did You Use and How Much? Meth  1-2 grams   Do You Currently Have a Therapist/Psychiatrist? No  Name of Therapist/Psychiatrist: No data recorded  Have You Been Recently Discharged From Any Office Practice or Programs? No  Explanation of Discharge From Practice/Program: No data recorded    CCA Screening Triage Referral Assessment Type of Contact: Tele-Assessment  Is this Initial or Reassessment? Initial Assessment  Date Telepsych consult ordered in [facility]:  08/21/2020  Time Telepsych consult ordered in [facility]:  1756   Patient Reported Information Reviewed? Yes  Patient Left Without Being Seen? No data recorded Reason for Not Completing Assessment: No data recorded  Collateral Involvement: No data recorded  Does Patient Have a Court Appointed Legal Guardian? No data recorded Name  and Contact of Legal Guardian: No data recorded If Minor and Not Living with Parent(s), Who has Custody? No data recorded Is CPS involved or ever been involved? Never  Is APS involved or ever been involved? Never   Patient Determined To Be At Risk for Harm To Self or Others Based on Review of Patient Reported Information or Presenting Complaint? No  Method: No data recorded Availability of Means: No data recorded Intent: No data recorded Notification Required: No data recorded Additional Information for Danger to Others Potential: No data recorded Additional Comments for Danger to Others Potential: No data recorded Are There Guns or Other Weapons in Your Home? No data recorded Types of Guns/Weapons: No data recorded Are These Weapons Safely Secured?                            No data recorded Who Could Verify You Are Able To Have These Secured: No data recorded Do You Have any Outstanding Charges, Pending Court Dates, Parole/Probation? No data recorded Contacted To Inform of Risk of Harm To Self or Others: No data recorded  Location of Assessment: [facility]   Does Patient Present under Involuntary Commitment? No  IVC  Papers Initial File Date: No data recorded  [place] of Residence: [place]   Patient Currently Receiving the Following Services: Not Receiving Services   Determination of Need: Urgent (48 hours)   Options For Referral: Other: Comment     CCA Biopsychosocial Intake/Chief Complaint:  Detox / Meth  Current Symptoms/Problems: anxiety / AVH   Patient Reported Schizophrenia/Schizoaffective Diagnosis in Past: No   Strengths: No data recorded Preferences: No data recorded Abilities: No data recorded  Type of Services Patient Feels are Needed: No data recorded  Initial Clinical Notes/Concerns: No data recorded  Mental Health Symptoms Depression:  None   Duration of Depressive symptoms: No data recorded  Mania:  N/A   Anxiety:   Restlessness    Psychosis:  Hallucinations   Duration of Psychotic symptoms: Greater than six months   Trauma:  N/A   Obsessions:  Recurrent & persistent thoughts/impulses/images   Compulsions:  None   Inattention:  N/A   Hyperactivity/Impulsivity:  N/A   Oppositional/Defiant Behaviors:  N/A   Emotional Irregularity:  N/A   Other Mood/Personality Symptoms:  No data recorded   Mental Status Exam Appearance and self-care  Stature:  Average   Weight:  Average weight   Clothing:  Casual   Grooming:  Normal   Cosmetic use:  None   Posture/gait:  Normal   Motor activity:  Not Remarkable   Sensorium  Attention:  Normal   Concentration:  Normal   Orientation:  X5   Recall/memory:  Normal   Affect and Mood  Affect:  Depressed   Mood:  Depressed   Relating  Eye contact:  None   Facial expression:  Depressed; Anxious   Attitude toward examiner:  Cooperative   Thought and Language  Speech flow: Normal; Clear and Coherent   Thought content:  Appropriate to Mood and Circumstances   Preoccupation:  None   Hallucinations:  Auditory; Visual   Organization:  No data recorded  [employer] of Knowledge:  Average   Intelligence:  Average   Abstraction:  Normal   Judgement:  Good   Reality Testing:  Realistic   Insight:  Good   Decision Making:  Normal   Social Functioning  Social Maturity:  Irresponsible   Social Judgement:  Normal   Stress  Stressors:  Family conflict; Other (Comment) (Substance use relapse)   Coping Ability:  Normal   Skill Deficits:  Self-control   Supports:  Family     Religion: Religion/Spirituality Are You A Religious Person?: No  Leisure/Recreation: Leisure / Recreation Do You Have Hobbies?: No  Exercise/Diet: Exercise/Diet Do You Exercise?: No Have You Gained or Lost A Significant Amount of Weight in the Past Six Months?: No Do You Follow a Special Diet?: No Do You Have Any Trouble Sleeping?:  No   CCA Employment/Education Employment/Work Situation: Employment / Work 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 illness: No Has patient ever been in the [employer]?: No  Education: Education Is Patient Currently Attending School?: No Did Garment/textile technologist From [employer]?:  (unknown) Did Theme park manager?: No Did Designer, television/film set?: No Did You Have An Individualized Education Program (IIEP): No Did You Have Any Difficulty At [employer]?: No Patient's Education Has Been Impacted by Current Illness: No   CCA Family/Childhood History Family and Relationship History: Family history Does patient have children?: Yes How many children?: 6 How is patient's relationship with their children?: good  Childhood History:  Childhood History By whom was/is the patient raised?: Mother  Additional childhood history information: None reported Description of patient's relationship with caregiver when they were a child: good Patient's description of current relationship with people who raised him/her: good Does patient have siblings?:  (unknown) Did patient suffer any verbal/emotional/physical/sexual abuse as a child?: No Did patient suffer from severe childhood neglect?: No Has patient ever been sexually abused/assaulted/raped as an adolescent or adult?: No Was the patient ever a victim of a crime or a disaster?: No Has patient been affected by domestic violence as an adult?: No  Child/Adolescent Assessment:     CCA Substance Use Alcohol/Drug Use: Alcohol / Drug Use Pain Medications: SEE MAR Prescriptions: SEE MAR Over the Counter: SEE MAR History of alcohol / drug use?: Yes Longest period of sobriety (when/how long): 9 mnths Negative Consequences of Use: Personal relationships Substance #1 Name of Substance 1: Meth 1 - Age of First Use: 26 1 - Amount (size/oz): 3-4 grams 1 - Frequency: every day 1 - Duration: 13 years 1 - Last Use / Amount: 2 weeks  ago Substance #2 Name of Substance 2: THC 2 - Age of First Use: unknown 2 - Amount (size/oz): small amount 2 - Frequency: not often 2 - Duration: years 2 - Last Use / Amount: 58mnth                     ASAM's:  Six Dimensions of Multidimensional Assessment  Dimension 1:  Acute Intoxication and/or Withdrawal Potential:      Dimension 2:  Biomedical Conditions and Complications:      Dimension 3:  Emotional, Behavioral, or Cognitive Conditions and Complications:     Dimension 4:  Readiness to Change:     Dimension 5:  Relapse, Continued use, or Continued Problem Potential:     Dimension 6:  Recovery/Living Environment:     ASAM Severity Score:    ASAM Recommended Level of Treatment:     Substance use Disorder (SUD)    Recommendations for Services/Supports/Treatments: Recommendations for Services/Supports/Treatments Recommendations For Services/Supports/Treatments: Detox  DSM5 Diagnoses: Patient Active Problem List   Diagnosis Date Noted  . Methamphetamine use disorder, severe ([place]) 08/21/2020  . Healthcare maintenance 06/17/2015  . GAD (generalized anxiety disorder) 05/27/2015    Patient Centered Plan: Patient is on the following Treatment Plan(s)   Referrals to Alternative Service(s): Referred to Alternative Service(s):   Place:   Date:   Time:    Referred to Alternative Service(s):   Place:   Date:   Time:    Referred to Alternative Service(s):   Place:   Date:   Time:    Referred to Alternative Service(s):   Place:   Date:   Time:     Gerre Scull, [place]

## 2020-08-21 NOTE — ED Notes (Addendum)
Spoke with Dr. Rubin Payor, pt is to be sent to treatment center tomorrow. Pt is in ED voluntarily. NAD noted at this time. Will continue to monitor.

## 2020-08-21 NOTE — ED Triage Notes (Addendum)
Pt c/o withdrawal from methamphetamine. Patient states he is paranoid, anxious, and scared.  Patient states he has been using meth once a week for the past two months.  Pt states he was at [facility] last night and was dc'd with a referral to [facility]. Pt states he went to [facility] with the intention of trying to get inpatient rehab services.  Pt states he was afraid he would be charged with possession of meth and he placed at least a gram of meth in his anus. Pt states he was able to retrieve the bag, but the meth was absorbed into his body.    Pt c/o chest pain during triage and an ekg was performed.

## 2020-08-21 NOTE — ED Provider Notes (Addendum)
[facility] EMERGENCY DEPARTMENT Provider Note   CSN: [id number] Arrival date & time: 08/21/20  1415     History Chief Complaint  Patient presents with  . Paranoid    Raymond Mckinney is a 40 y.o. male.  HPI  Patient presents with methamphetamine abuse/paranoia.  States has been using methamphetamine for couple months now.  Previous heavy user but had been clean while he was in jail.  Had an been in jail for 2 to 3 years when he got out started having some hallucinations.  States audio and visual.  States feels it continued even while he was not using methamphetamines.  States he started using it a couple months ago.  Uses once or twice a week but states he gets paranoid and does hallucinate even when he is not intoxicated on it.  States he went into [facility] on Thursday to get help with his substance abuse.  States that he requires sedation.  States that during that time he had placed a bag of methamphetamine in his anus so he would not get charged with possession.  States he was able to get the bag out later but it was empty.  Thinks that around Friday or Saturday is when the bag lost its integrity.  States he got pretty high off of that.  States he had to get handcuffed to the bed.  States he was discharged with instructions to go to [facility].  Does not know if they actually set something up for him or he was just given the resource.  States he was too paranoid to go.  States he went home last night and when the wind was blowing he was sure that it was helicopters coming to get him.  Patient denies frank suicidal thoughts but states he would rather die than continue going away this is.  States he has not used amphetamines since leaving the hospital yesterday.     Past Medical History:  Diagnosis Date  . Anxiety   . Brain injury (HCC) 08/2015  . Depression   . Hypertension   . Seizures (HCC)    08/2015    Patient Active Problem List   Diagnosis Date Noted  . Healthcare maintenance  06/17/2015  . GAD (generalized anxiety disorder) 05/27/2015    Past Surgical History:  Procedure Laterality Date  . HAND SURGERY    . OTHER SURGICAL HISTORY Left Hand surgery       Family History  Problem Relation Age of Onset  . Dementia Father   . Diabetes Sister     Social History   Tobacco Use  . Smoking status: Current Some Day Smoker    Packs/day: 1.50    Types: Cigarettes  . Smokeless tobacco: Never Used  Vaping Use  . Vaping Use: Never used  Substance Use Topics  . Alcohol use: Not Currently  . Drug use: Yes    Types: Marijuana, Methamphetamines    Comment: meth last night- MJ once a month    Home Medications Prior to Admission medications   Medication Sig Start Date End Date Taking? Authorizing Provider  Aspirin-Salicylamide-Caffeine (BC HEADACHE POWDER PO) Take by mouth as needed.    [provider]    Allergies    Corticosteroids, Cortizone-5 [hydrocortisone base], Shellfish allergy, Penicillins, and Penicillins  Review of Systems   Review of Systems  Constitutional: Negative for appetite change.  HENT: Negative for congestion.   Eyes: Negative for redness.  Respiratory: Negative for shortness of breath.   Gastrointestinal:  Negative for abdominal pain.  Genitourinary: Negative for flank pain.  Musculoskeletal: Negative for back pain.  Skin: Negative for pallor.  Neurological: Negative for weakness.  Psychiatric/Behavioral: Positive for hallucinations.    Physical Exam Updated Vital Signs BP 115/73   Pulse (!) 103   Temp 98.4 F (36.9 C) (Oral)   Resp 19   Ht 5\' 7"  (1.702 m)   Wt 67.4 kg   SpO2 100%   BMI 23.27 kg/m   Physical Exam Vitals and nursing note reviewed.  HENT:     Head: Normocephalic.     Right Ear: External ear normal.     Left Ear: External ear normal.  Eyes:     Pupils: Pupils are equal, round, and reactive to light.  Cardiovascular:     Rate and Rhythm: Regular rhythm.  Abdominal:     Tenderness: There  is no abdominal tenderness.  Musculoskeletal:        General: No tenderness.     Cervical back: Neck supple.  Skin:    General: Skin is warm.     Capillary Refill: Capillary refill takes less than 2 seconds.  Neurological:     Mental Status: He is alert and oriented to person, place, and time.  Psychiatric:        Behavior: Behavior normal.     ED Results / Procedures / Treatments   Labs (all labs ordered are listed, but only abnormal results are displayed) Labs Reviewed  RESP PANEL BY RT-PCR (FLU A&B, COVID) [id number]    EKG EKG Interpretation  Date/Time:  Sunday August 21 2020 14:54:43 EST Ventricular Rate:  103 PR Interval:    QRS Duration: 78 QT Interval:  350 QTC Calculation: 458 R Axis:   83 Text Interpretation: Sinus rhythm Minimal voltage criteria for LVH, may be normal variant ( Sokolow-Lyon ) Anterior infarct , age undetermined Abnormal ECG Confirmed by [id number] [phone]) on 08/21/2020 3:16:37 PM   Radiology No results found.  Procedures Procedures (including critical care time)  Medications Ordered in ED Medications  LORazepam (ATIVAN) tablet 1 mg (has no administration in time range)    ED Course  I have reviewed the triage vital signs and the nursing notes.  Pertinent labs & imaging results that were available during my care of the patient were reviewed by me and considered in my medical decision making (see chart for details).    MDM Rules/Calculators/A&P                          Patient with methamphetamine abuse and also paranoia/hallucinations.  He did start having hallucinations when he had not been using methamphetamines potentially could be an underlying psychiatric disorder also.  States he was too paranoid to be able to go to [facility].  I have reviewed the blood work from [facility] and patient is medically cleared and does not need further work-up here otherwise we will add a Covid test since that I do not see that.  Will have patient  evaluated by TTS.  TTS is cleared patient from needing emergent psychiatric treatment.  Patient continues with paranoia.  Patient's mother is called and states patient is calling her convinced of the police are coming to get him.  Discussed again with [facility] from psychiatry.  Thinks patient would be a good candidate for [facility] but cannot get him in today.  After discussion he feels patient probably benefit from spending the night in the ER and hopefully  getting a placement tomorrow.  Patient however is voluntary and if he wants to leave he cannot. Final Clinical Impression(s) / ED Diagnoses Final diagnoses:  Methamphetamine abuse ([place])  Paranoia ([place])    Rx / [place] Orders ED Discharge Orders    None       Davonna Belling, MD 08/21/20 1552    Davonna Belling, MD 08/21/20 2356

## 2020-08-21 NOTE — ED Notes (Signed)
Pt requesting to be personally contacted for questions to care or billing at [phone].  Pt resting in bed, bed is locked in the lowest position, side rails x1 at patient request. Call bell within reach. All questions and concerns voiced addressed.

## 2020-08-21 NOTE — ED Notes (Signed)
Pt speaking with TTS at this time.  

## 2020-08-21 NOTE — ED Notes (Signed)
This RN received a phone call from patient's mother, Aggie Cosier, who was in distress stating "can someone please go in there and tell him that he's not being arrested. He said that the cops are here for him and he's so paranoid that he doesn't understand what's happening." This RN educated mother on plan of care and is in agreement at this time.   This RN then rounded on patient, found him to be restless, anxious and unsettled. Pt reports "I was listening to this scanner app and I head that the cops are here for me. I want to speak with the doctor because this is not making sense." Pt educated on plan of care, pts fears addressed and redirected at this time. Provided with a ginger ale and pt is calm and cooperative at this time.

## 2020-08-22 MED ORDER — LORAZEPAM 1 MG PO TABS
2.0000 mg | ORAL_TABLET | Freq: Once | ORAL | Status: AC
  Administered 2020-08-22: 2 mg via ORAL
  Filled 2020-08-22: qty 2

## 2020-08-22 MED ORDER — NICOTINE 21 MG/24HR TD PT24
21.0000 mg | MEDICATED_PATCH | Freq: Once | TRANSDERMAL | Status: DC
  Administered 2020-08-22: 21 mg via TRANSDERMAL
  Filled 2020-08-22: qty 1

## 2020-08-22 MED ORDER — ZIPRASIDONE MESYLATE 20 MG IM SOLR: 20.0000 mg | Freq: Once | INTRAMUSCULAR | Status: DC

## 2020-08-22 MED ORDER — LORAZEPAM 2 MG/ML IJ SOLN
2.0000 mg | Freq: Once | INTRAMUSCULAR | Status: DC
  Filled 2020-08-22: qty 1

## 2020-08-22 MED ORDER — LORAZEPAM 2 MG/ML IJ SOLN
2.0000 mg | Freq: Once | INTRAMUSCULAR | Status: AC
  Administered 2020-08-22: 2 mg via INTRAMUSCULAR

## 2020-08-22 NOTE — ED Notes (Signed)
Pt loud and cussing, states he heard someone out here stating someone was going to choke him.

## 2020-08-22 NOTE — ED Notes (Signed)
Pt continues to walk out of room unmasked demanding water and to speak to the doctor.  Demanding ICE Water.  Consulted with doctor and pt continues to not be cooperative, provider ready to discharge pt home.

## 2020-08-22 NOTE — ED Notes (Signed)
Pt discharged home given paperwork.  Pt said I appreciate all yall have done.  VERY good job as he exits the area.

## 2022-10-05 ENCOUNTER — Encounter: Payer: Self-pay | Admitting: Family Medicine

## 2022-10-05 ENCOUNTER — Ambulatory Visit: Payer: Medicaid Other | Admitting: Family Medicine

## 2022-10-05 ENCOUNTER — Other Ambulatory Visit (INDEPENDENT_AMBULATORY_CARE_PROVIDER_SITE_OTHER): Payer: Medicaid Other

## 2022-10-05 VITALS — BP 103/68 | HR 75 | Temp 98.8°F | Ht 67.0 in | Wt 161.0 lb

## 2022-10-05 DIAGNOSIS — R079 Chest pain, unspecified: Secondary | ICD-10-CM

## 2022-10-05 DIAGNOSIS — R002 Palpitations: Secondary | ICD-10-CM

## 2022-10-05 DIAGNOSIS — G40909 Epilepsy, unspecified, not intractable, without status epilepticus: Secondary | ICD-10-CM | POA: Diagnosis not present

## 2022-10-05 DIAGNOSIS — F1511 Other stimulant abuse, in remission: Secondary | ICD-10-CM | POA: Diagnosis not present

## 2022-10-05 DIAGNOSIS — Z136 Encounter for screening for cardiovascular disorders: Secondary | ICD-10-CM
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
## 2022-10-06 LAB — CMP14+EGFR
ALT: 18 IU/L (ref 0–44)
AST: 16 IU/L (ref 0–40)
Albumin/Globulin Ratio: 1.8 (ref 1.2–2.2)
Albumin: 4.7 g/dL (ref 4.1–5.1)
Alkaline Phosphatase: 77 IU/L (ref 44–121)
BUN/Creatinine Ratio: 11 (ref 9–20)
BUN: 13 mg/dL (ref 6–24)
Bilirubin Total: 0.2 mg/dL (ref 0.0–1.2)
CO2: 23 mmol/L (ref 20–29)
Calcium: 10 mg/dL (ref 8.7–10.2)
Chloride: 103 mmol/L (ref 96–106)
Creatinine, Ser: 1.18 mg/dL (ref 0.76–1.27)
Globulin, Total: 2.6 g/dL (ref 1.5–4.5)
Glucose: 78 mg/dL (ref 70–99)
Potassium: 4.7 mmol/L (ref 3.5–5.2)
Sodium: 141 mmol/L (ref 134–144)
Total Protein: 7.3 g/dL (ref 6.0–8.5)
eGFR: 80 mL/min/{1.73_m2} (ref >59)

## 2022-10-06 LAB — CBC WITH DIFFERENTIAL/PLATELET
Basophils Absolute: 0 10*3/uL (ref 0.0–0.2)
Basos: 1 %
EOS (ABSOLUTE): 0.2 10*3/uL (ref 0.0–0.4)
Eos: 3 %
Hematocrit: 49.5 % (ref 37.5–51.0)
Hemoglobin: 17 g/dL (ref 13.0–17.7)
Immature Grans (Abs): 0 10*3/uL (ref 0.0–0.1)
Immature Granulocytes: 0 %
Lymphocytes Absolute: 1.8 10*3/uL (ref 0.7–3.1)
Lymphs: 22 %
MCH: 30.1 pg (ref 26.6–33.0)
MCHC: 34.3 g/dL (ref 31.5–35.7)
MCV: 88 fL (ref 79–97)
Monocytes Absolute: 0.7 10*3/uL (ref 0.1–0.9)
Monocytes: 9 %
Neutrophils Absolute: 5.2 10*3/uL (ref 1.4–7.0)
Neutrophils: 65 %
Platelets: 369 10*3/uL (ref 150–450)
RBC: 5.65 x10E6/uL (ref 4.14–5.80)
RDW: 12.2 % (ref 11.6–15.4)
WBC: 8 10*3/uL (ref 3.4–10.8)

## 2022-10-06 LAB — LIPID PANEL
Chol/HDL Ratio: 3.1 ratio (ref 0.0–5.0)
Cholesterol, Total: 162 mg/dL (ref 100–199)
HDL: 53 mg/dL (ref >39)
LDL Chol Calc (NIH): 94 mg/dL (ref 0–99)
Triglycerides: 81 mg/dL (ref 0–149)
VLDL Cholesterol Cal: 15 mg/dL (ref 5–40)

## 2022-10-06 LAB — TSH: TSH: 1.89 u[IU]/mL (ref 0.450–4.500)

## 2022-10-10 LAB — TOXASSURE SELECT 13 (MW), URINE

## 2022-10-19 ENCOUNTER — Ambulatory Visit (INDEPENDENT_AMBULATORY_CARE_PROVIDER_SITE_OTHER): Payer: Medicaid Other | Admitting: Family Medicine

## 2022-10-19 ENCOUNTER — Encounter: Payer: Self-pay | Admitting: Family Medicine

## 2022-10-19 VITALS — BP 105/63 | HR 76 | Temp 98.8°F | Ht 67.0 in | Wt 159.0 lb

## 2022-10-19 DIAGNOSIS — Z1211 Encounter for screening for malignant neoplasm of colon: Secondary | ICD-10-CM

## 2022-10-19 DIAGNOSIS — Z3009 Encounter for other general counseling and advice on contraception: Secondary | ICD-10-CM | POA: Diagnosis not present

## 2022-10-19 DIAGNOSIS — Z87898 Personal history of other specified conditions: Secondary | ICD-10-CM | POA: Diagnosis not present

## 2022-10-19 DIAGNOSIS — F17200 Nicotine dependence, unspecified, uncomplicated: Secondary | ICD-10-CM | POA: Diagnosis not present

## 2022-10-19 DIAGNOSIS — Z0000 Encounter for general adult medical examination without abnormal findings: Secondary | ICD-10-CM

## 2022-10-19 DIAGNOSIS — Z0001 Encounter for general adult medical examination with abnormal findings: Secondary | ICD-10-CM | POA: Diagnosis not present

## 2022-10-19 NOTE — Progress Notes (Signed)
Raymond Mckinney is a 42 y.o. male presents to office today for annual physical exam examination.    Concerns today include: 1. Took off zio this am and sent it in the mail.   Occupation: Radiographer, therapeutic  Marital status: Married  Substance use: former, IV  Diet: pretty well. No breakfast, meat, vegetables, avoids cheese, lots of milk  Exercise: none  Last eye exam: not in last 1.5 years. Endorses small issues focusing  Last dental exam: Does not have a dentist  Last colonoscopy: due for one!  Fasting today:  yes  Immunizations needed: Flu Vaccine: yes declines  Tdap Vaccine: declines   - every 35yr - (<3 lifetime doses or unknown): all wounds -- look up need for Tetanus IG - (>=3 lifetime doses): clean/minor wound if >130yrfrom previous; all other wounds if >5y44yrrom previous Zoster Vaccine: no (those >50yo, once) Pneumonia Vaccine: no (those w/ risk factors) - (<65y58yrth: Immunocompromised, cochlear implant, CSF leak, asplenic, sickle cell, Chronic Renal Failure - (<92yr21yrV-23 only: Heart dz, lung disease, DM, tobacco abuse, alcoholism, cirrhosis/liver disease. - (>92yr)50yrV13 then PPSV23 in 6-12mths;  - (>92yr):45yrat PPSV23 once if pt received prior to 42yo and 39yrs ha99yrassed   Past Medical History:  Diagnosis Date   Anxiety    Brain injury ([facility]) 01/[place]   Depression    Hypertension    Seizures ([facility])    [place]017   Social History   Socioeconomic History   Marital status: Married    Spouse name: Not on file   Number of children: 5   Years of education: GED   Highest education level: Not on file  Occupational History    Comment: [employer]  Tobacco Use   Smoking status: Some Days    Packs/day: 1.50    Types: Cigarettes   Smokeless tobacco: Never  Vaping Use   Vaping Use: Never used  Substance and Sexual Activity   Alcohol use: Not Currently   Drug use: Yes    Types: Marijuana, Methamphetamines    Comment: meth last night- MJ once a  month   Sexual activity: Not on file  Other Topics Concern   Not on file  Social History Narrative   Lives with family   Caffeine- Mtn Dew once a day, lots of tea   Social Determinants of Health   Financial Resource Strain: Not on file  Food Insecurity: Not on file  Transportation Needs: Not on file  Physical Activity: Not on file  Stress: Not on file  Social Connections: Not on file  Intimate Partner Violence: Not on file   Past Surgical History:  Procedure Laterality Date   HAND SURGERY     OTHER SURGICAL HISTORY Left Hand surgery   Family History  Problem Relation Age of Onset   Dementia Father    Diabetes Sister     Current Outpatient Medications:    Aspirin-Salicylamide-Caffeine (BC HEADACHE POWDER PO), Take by mouth as needed., Disp: , Rfl:   Allergies  Allergen Reactions   Cortizone-5 [Hydrocortisone Base] Anaphylaxis   Shellfish Allergy Anaphylaxis and Hives   Penicillins    Penicillins Other (See Comments)    As child     ROS: Review of Systems Pertinent items are noted in HPI.  Physical exam Physical Exam Constitutional:      Appearance: Normal appearance. He is normal weight.  HENT:     Right Ear: Tympanic membrane normal.     Left Ear: Tympanic membrane  normal.     Nose: Nose normal.     Mouth/Throat:     Mouth: Mucous membranes are moist.     Pharynx: Oropharynx is clear. No oropharyngeal exudate or posterior oropharyngeal erythema.  Eyes:     Conjunctiva/sclera: Conjunctivae normal.     Pupils: Pupils are equal, round, and reactive to light.  Cardiovascular:     Rate and Rhythm: Normal rate and regular rhythm.     Pulses: Normal pulses.     Heart sounds: Normal heart sounds.  Abdominal:     General: Abdomen is flat. Bowel sounds are normal.     Palpations: Abdomen is soft.  Musculoskeletal:        General: Normal range of motion.     Cervical back: Normal range of motion.  Skin:    General: Skin is warm.     Capillary Refill:  Capillary refill takes less than 2 seconds.  Neurological:     General: No focal deficit present.     Mental Status: He is alert and oriented to person, place, and time. Mental status is at baseline.     Cranial Nerves: No cranial nerve deficit.     Motor: No weakness.     Gait: Gait normal.  Psychiatric:        Mood and Affect: Mood normal.        Behavior: Behavior normal.        Thought Content: Thought content normal.        Judgment: Judgment normal.     Assessment/ Plan: Raymond Mckinney here for annual physical exam.  1. Routine general medical examination at a health care facility Discussed with patient to continue healthy lifestyle choices, including diet (rich in fruits, vegetables, and lean proteins, and low in salt and simple carbohydrates) and exercise (at least 30 minutes of moderate physical activity daily). Limit beverages high is sugar. Recommended at least 80-100 oz of water daily.   2. Screening for colon cancer Referral placed as below for colonoscopy. Will await results for future care planning.  - Ambulatory referral to Gastroenterology  3. History of unhealthy intravenous substance use Pt has history of IVDU and requested screening for related diseases. Labs as below. Will communicate results to patient once available.  - RPR - HepB+HepC+HIV Panel  4. Encounter for contraceptive planning Referral placed as below for evaluation for vasectomy.  - Ambulatory referral to Urology  5. Tobacco use disorder Pt is working on ways to quit smoking! Is using nicotine patches and On pouches.     Patient to follow up in 1 year for annual exam or sooner if needed.  The above assessment and management plan was discussed with the patient. The patient verbalized understanding of and has agreed to the management plan. Patient is aware to call the clinic if symptoms persist or worsen. Patient is aware when to return to the clinic for a follow-up visit. Patient educated on when  it is appropriate to go to the emergency department.   Donzetta Kohut, DNP-FNP [facility] [address] [place], [place] [address] [phone]

## 2022-10-19 NOTE — Patient Instructions (Signed)
Health Maintenance, Male Adopting a healthy lifestyle and getting preventive care are important in promoting health and wellness. Ask your health care provider about: The right schedule for you to have regular tests and exams. Things you can do on your own to prevent diseases and keep yourself healthy. What should I know about diet, weight, and exercise? Eat a healthy diet  Eat a diet that includes plenty of vegetables, fruits, low-fat dairy products, and lean protein. Do not eat a lot of foods that are high in solid fats, added sugars, or sodium. Maintain a healthy weight Body mass index (BMI) is a measurement that can be used to identify possible weight problems. It estimates body fat based on height and weight. Your health care provider can help determine your BMI and help you achieve or maintain a healthy weight. Get regular exercise Get regular exercise. This is one of the most important things you can do for your health. Most adults should: Exercise for at least 150 minutes each week. The exercise should increase your heart rate and make you sweat (moderate-intensity exercise). Do strengthening exercises at least twice a week. This is in addition to the moderate-intensity exercise. Spend less time sitting. Even light physical activity can be beneficial. Watch cholesterol and blood lipids Have your blood tested for lipids and cholesterol at 41 years of age, then have this test every 5 years. You may need to have your cholesterol levels checked more often if: Your lipid or cholesterol levels are high. You are older than 40 years of age. You are at high risk for heart disease. What should I know about cancer screening? Many types of cancers can be detected early and may often be prevented. Depending on your health history and family history, you may need to have cancer screening at various ages. This may include screening for: Colorectal cancer. Prostate cancer. Skin cancer. Lung  cancer. What should I know about heart disease, diabetes, and high blood pressure? Blood pressure and heart disease High blood pressure causes heart disease and increases the risk of stroke. This is more likely to develop in people who have high blood pressure readings or are overweight. Talk with your health care provider about your target blood pressure readings. Have your blood pressure checked: Every 3-5 years if you are 18-39 years of age. Every year if you are 40 years old or older. If you are between the ages of 65 and 75 and are a current or former smoker, ask your health care provider if you should have a one-time screening for abdominal aortic aneurysm (AAA). Diabetes Have regular diabetes screenings. This checks your fasting blood sugar level. Have the screening done: Once every three years after age 45 if you are at a normal weight and have a low risk for diabetes. More often and at a younger age if you are overweight or have a high risk for diabetes. What should I know about preventing infection? Hepatitis B If you have a higher risk for hepatitis B, you should be screened for this virus. Talk with your health care provider to find out if you are at risk for hepatitis B infection. Hepatitis C Blood testing is recommended for: Everyone born from 1945 through 1965. Anyone with known risk factors for hepatitis C. Sexually transmitted infections (STIs) You should be screened each year for STIs, including gonorrhea and chlamydia, if: You are sexually active and are younger than 41 years of age. You are older than 41 years of age and your   health care provider tells you that you are at risk for this type of infection. Your sexual activity has changed since you were last screened, and you are at increased risk for chlamydia or gonorrhea. Ask your health care provider if you are at risk. Ask your health care provider about whether you are at high risk for HIV. Your health care provider  may recommend a prescription medicine to help prevent HIV infection. If you choose to take medicine to prevent HIV, you should first get tested for HIV. You should then be tested every 3 months for as long as you are taking the medicine. Follow these instructions at home: Alcohol use Do not drink alcohol if your health care provider tells you not to drink. If you drink alcohol: Limit how much you have to 0-2 drinks a day. Know how much alcohol is in your drink. In the [place] one drink equals one 12 oz bottle of beer (355 mL), one 5 oz glass of wine (148 mL), or one 1 oz glass of hard liquor (44 mL). Lifestyle Do not use any products that contain nicotine or tobacco. These products include cigarettes, chewing tobacco, and vaping devices, such as e-cigarettes. If you need help quitting, ask your health care provider. Do not use street drugs. Do not share needles. Ask your health care provider for help if you need support or information about quitting drugs. General instructions Schedule regular health, dental, and eye exams. Stay current with your vaccines. Tell your health care provider if: You often feel depressed. You have ever been abused or do not feel safe at home. Summary Adopting a healthy lifestyle and getting preventive care are important in promoting health and wellness. Follow your health care provider's instructions about healthy diet, exercising, and getting tested or screened for diseases. Follow your health care provider's instructions on monitoring your cholesterol and blood pressure. This information is not intended to replace advice given to you by your health care provider. Make sure you discuss any questions you have with your health care provider. Document Revised: 12/26/2020 Document Reviewed: 12/26/2020 Elsevier Patient Education  2023 Elsevier Inc.  

## 2022-10-21 LAB — RPR: RPR Ser Ql: NONREACTIVE

## 2022-10-21 LAB — HEPB+HEPC+HIV PANEL
HIV Screen 4th Generation wRfx: NONREACTIVE
Hep B C IgM: NEGATIVE
Hep B Core Total Ab: NEGATIVE
Hep B E Ab: NEGATIVE
Hep B E Ag: NEGATIVE
Hep B Surface Ab, Qual: NONREACTIVE
Hep C Virus Ab: REACTIVE — AB
Hepatitis B Surface Ag: NEGATIVE

## 2022-10-23 ENCOUNTER — Encounter: Payer: Self-pay | Admitting: *Deleted

## 2022-10-23 ENCOUNTER — Telehealth: Payer: Self-pay | Admitting: *Deleted

## 2022-10-23 NOTE — Addendum Note (Signed)
Addended by: Donzetta Kohut on: 10/23/2022 07:47 AM   Modules accepted: Orders

## 2022-10-24 ENCOUNTER — Other Ambulatory Visit: Payer: Self-pay | Admitting: *Deleted

## 2022-10-24 ENCOUNTER — Other Ambulatory Visit: Payer: Medicaid Other

## 2022-10-24 DIAGNOSIS — R768 Other specified abnormal immunological findings in serum: Secondary | ICD-10-CM

## 2022-10-25 LAB — HCV RNA BY PCR, QN RFX GENO

## 2022-10-26 LAB — HCV RNA BY PCR, QN RFX GENO
HCV Quant Baseline: 370 IU/mL
HCV log10: 2.568 log10 IU/mL

## 2022-11-05 ENCOUNTER — Telehealth (INDEPENDENT_AMBULATORY_CARE_PROVIDER_SITE_OTHER): Payer: Self-pay | Admitting: *Deleted

## 2022-11-05 NOTE — Telephone Encounter (Signed)
Received triage questionnaire. Due to hx and recent chest pain complaint with pcp will need OV to discuss scheduling.

## 2022-11-06 ENCOUNTER — Encounter: Payer: Self-pay | Admitting: Internal Medicine

## 2022-11-06 ENCOUNTER — Ambulatory Visit (INDEPENDENT_AMBULATORY_CARE_PROVIDER_SITE_OTHER): Payer: Medicaid Other | Admitting: Internal Medicine

## 2022-11-06 ENCOUNTER — Other Ambulatory Visit: Payer: Self-pay

## 2022-11-06 VITALS — BP 119/77 | HR 85 | Temp 98.9°F | Ht 66.0 in | Wt 164.0 lb

## 2022-11-06 DIAGNOSIS — B182 Chronic viral hepatitis C: Secondary | ICD-10-CM | POA: Insufficient documentation

## 2022-11-06 DIAGNOSIS — F1511 Other stimulant abuse, in remission: Secondary | ICD-10-CM | POA: Diagnosis not present

## 2022-11-06 NOTE — Progress Notes (Signed)
[facility]  Reason for Consult: Hepatitis C  Referring Provider: Donzetta Kohut, FNP   HPI:    Raymond Mckinney is a 42 y.o. male with PMHx as below who presents to the clinic for hepatitis C.   Patient presents today as a new patient for hepatitis C.  He was referred by his primary care provider at [facility].  He has a history of injection drug use.  He requested screening labs on 10/19/2022 with his PCP.  He reports his last use of intravenous drugs was on in January 2021.  His lab work obtained by his PCP was notable for negative hepatitis B surface antigen, negative HIV screen, and negative RPR.  His hepatitis C antibody was reactive and confirmatory viral load was detectable at 370 copies.  Of note, he previously had hepatitis C antibody that was negative in October 2016.  He also reports a period of incarceration from 2017-2019 where he was tested upon entry and exit from the prison system and was negative.  Given this information he likely acquired HCV between 2019 and 2021.  Patient's Medications  New Prescriptions   No medications on file  Previous Medications   ASPIRIN-SALICYLAMIDE-CAFFEINE (BC HEADACHE POWDER PO)    Take by mouth as needed.  Modified Medications   No medications on file  Discontinued Medications   No medications on file      Past Medical History:  Diagnosis Date   Anxiety    Brain injury ([place]) 08/2015   Depression    Hypertension    Seizures ([place])    08/2015    Social History   Tobacco Use   Smoking status: Some Days    Packs/day: 1.5    Types: Cigarettes   Smokeless tobacco: Never  Vaping Use   Vaping Use: Never used  Substance Use Topics   Alcohol use: Not Currently   Drug use: Not Currently    Types: Marijuana, Methamphetamines    Comment: meth last night- MJ once a month    Family History  Problem Relation Age of Onset   Dementia Father    Diabetes Sister     Allergies   Allergen Reactions   Shellfish Allergy Anaphylaxis and Hives   Penicillins    Penicillins Other (See Comments)    As child    Review of Systems  Constitutional: Negative.   Gastrointestinal: Negative.   Skin: Negative.       OBJECTIVE:    Vitals:   11/06/22 1442  BP: 119/77  Pulse: 85  Temp: 98.9 F (37.2 C)  TempSrc: Temporal  Weight: 164 lb (74.4 kg)  Height: 5\' 6"  (1.676 m)     Body mass index is 26.47 kg/m.  Physical Exam Constitutional:      Appearance: Normal appearance.  HENT:     Head: Normocephalic and atraumatic.  Pulmonary:     Effort: Pulmonary effort is normal. No respiratory distress.  Skin:    General: Skin is warm and dry.     Coloration: Skin is not jaundiced.  Neurological:     General: No focal deficit present.     Mental Status: He is alert and oriented to person, place, and time.      Labs and Microbiology:     Latest Ref Rng & Units 10/05/2022    9:47 AM 06/17/2015    8:21 AM 12/20/2014    9:36 PM  CBC  WBC 3.4 - 10.8 x10E3/uL 8.0  7.2    Hemoglobin 13.0 - 17.7 g/dL 17.0  15.0  17.0   Hematocrit 37.5 - 51.0 % 49.5  44.4  50.0   Platelets 150 - 450 x10E3/uL 369  315        Latest Ref Rng & Units 10/05/2022    9:47 AM 06/17/2015    8:21 AM 12/20/2014    9:36 PM  CMP  Glucose 70 - 99 mg/dL 78  145  87   BUN 6 - 24 mg/dL 13  11  6    Creatinine 0.76 - 1.27 mg/dL 1.18  0.98  1.40   Sodium 134 - 144 mmol/L 141  141  146   Potassium 3.5 - 5.2 mmol/L 4.7  4.8  3.0   Chloride 96 - 106 mmol/L 103  103  109   CO2 20 - 29 mmol/L 23  20    Calcium 8.7 - 10.2 mg/dL 10.0  9.5    Total Protein 6.0 - 8.5 g/dL 7.3  6.5    Total Bilirubin 0.0 - 1.2 mg/dL 0.2  0.3    Alkaline Phos 44 - 121 IU/L 77  57    AST 0 - 40 IU/L 16  19    ALT 0 - 44 IU/L 18  12       No results found for this or any previous visit (from the past 240 hour(s)).  Imaging:    ASSESSMENT & PLAN:    Chronic hepatitis C without hepatic coma ([place]) Discussed with  patient his initial lab work showing evidence of chronic hepatitis C with low level viremia.  Discussed that he may be clearing the infection on his own but would have anticipated clearance if he acquired infection prior to last using drugs in early 2021.  His LFTs are normal.  His platelets are also normal.  Will check HCV RNA and genotype, Fibrosis score, and screen for hep A and B immunity.  His FIB-4 score is 0.41 so do not think liver US with elastography is needed unless Fibrosis score abnormal.  Follow up in 2 weeks.   History of methamphetamine abuse ([place]) He has been clean and abstinent of IVDU for greater than 2 years which is fantastic.     Orders Placed This Encounter  Procedures   Hepatitis B core antibody, total   Hepatitis B surface antibody,qualitative   Hepatitis B surface antigen   Hepatitis C RNA quantitative   Hepatitis C genotype   Protime-INR   Liver Fibrosis, FibroTest-ActiTest   CBC   COMPLETE METABOLIC PANEL WITH GFR   Hepatitis A antibody, total       Raynelle [facility] for Infectious [facility] 11/06/2022, 3:14 PM

## 2022-11-06 NOTE — Assessment & Plan Note (Signed)
He has been clean and abstinent of IVDU for greater than 2 years which is fantastic.

## 2022-11-06 NOTE — Assessment & Plan Note (Signed)
Discussed with patient his initial lab work showing evidence of chronic hepatitis C with low level viremia.  Discussed that he may be clearing the infection on his own but would have anticipated clearance if he acquired infection prior to last using drugs in early 2021.  His LFTs are normal.  His platelets are also normal.  Will check HCV RNA and genotype, Fibrosis score, and screen for hep A and B immunity.  His FIB-4 score is 0.41 so do not think liver US with elastography is needed unless Fibrosis score abnormal.  Follow up in 2 weeks.

## 2022-11-09 ENCOUNTER — Encounter: Payer: Self-pay | Admitting: *Deleted

## 2022-11-09 NOTE — Telephone Encounter (Signed)
Apt scheduled.  

## 2022-11-13 LAB — LIVER FIBROSIS, FIBROTEST-ACTITEST
ALT: 15 U/L (ref 9–46)
Alpha-2-Macroglobulin: 157 mg/dL (ref 106–279)
Apolipoprotein A1: 174 mg/dL (ref 94–176)
Bilirubin: 0.7 mg/dL (ref 0.2–1.2)
Fibrosis Score: 0.1
GGT: 13 U/L (ref 3–95)
Haptoglobin: 93 mg/dL (ref 43–212)
Necroinflammat ACT Score: 0.04
Reference ID: 4837610

## 2022-11-13 LAB — COMPLETE METABOLIC PANEL WITH GFR
AG Ratio: 1.5 (calc) (ref 1.0–2.5)
ALT: 15 U/L (ref 9–46)
AST: 19 U/L (ref 10–40)
Albumin: 4.6 g/dL (ref 3.6–5.1)
Alkaline phosphatase (APISO): 62 U/L (ref 36–130)
BUN: 17 mg/dL (ref 7–25)
CO2: 24 mmol/L (ref 20–32)
Calcium: 9.8 mg/dL (ref 8.6–10.3)
Chloride: 105 mmol/L (ref 98–110)
Creat: 1.06 mg/dL (ref 0.60–1.29)
Globulin: 3.1 g/dL (calc) (ref 1.9–3.7)
Glucose, Bld: 85 mg/dL (ref 65–99)
Potassium: 4 mmol/L (ref 3.5–5.3)
Sodium: 138 mmol/L (ref 135–146)
Total Bilirubin: 0.7 mg/dL (ref 0.2–1.2)
Total Protein: 7.7 g/dL (ref 6.1–8.1)
eGFR: 91 mL/min/{1.73_m2} (ref >=60)

## 2022-11-13 LAB — CBC
HCT: 49.2 % (ref 38.5–50.0)
Hemoglobin: 16.9 g/dL (ref 13.2–17.1)
MCH: 30.2 pg (ref 27.0–33.0)
MCHC: 34.3 g/dL (ref 32.0–36.0)
MCV: 88 fL (ref 80.0–100.0)
MPV: 10.3 fL (ref 7.5–12.5)
Platelets: 363 10*3/uL (ref 140–400)
RBC: 5.59 10*6/uL (ref 4.20–5.80)
RDW: 12.4 % (ref 11.0–15.0)
WBC: 7.2 10*3/uL (ref 3.8–10.8)

## 2022-11-13 LAB — HEPATITIS B SURFACE ANTIBODY,QUALITATIVE: Hep B S Ab: NONREACTIVE

## 2022-11-13 LAB — HEPATITIS B CORE ANTIBODY, TOTAL: Hep B Core Total Ab: NONREACTIVE

## 2022-11-13 LAB — HEPATITIS C GENOTYPE

## 2022-11-13 LAB — PROTIME-INR
INR: 1.1
Prothrombin Time: 11.1 s (ref 9.0–11.5)

## 2022-11-13 LAB — HEPATITIS C RNA QUANTITATIVE
HCV Quantitative Log: 2.99 log IU/mL — ABNORMAL HIGH
HCV RNA, PCR, QN: 980 IU/mL — ABNORMAL HIGH

## 2022-11-13 LAB — HEPATITIS B SURFACE ANTIGEN: Hepatitis B Surface Ag: NONREACTIVE

## 2022-11-13 LAB — HEPATITIS A ANTIBODY, TOTAL: Hepatitis A AB,Total: NONREACTIVE

## 2022-11-16 ENCOUNTER — Other Ambulatory Visit (HOSPITAL_COMMUNITY): Payer: Self-pay

## 2022-11-16 ENCOUNTER — Telehealth: Payer: Self-pay | Admitting: Pharmacist

## 2022-11-16 ENCOUNTER — Other Ambulatory Visit: Payer: Self-pay

## 2022-11-16 DIAGNOSIS — B182 Chronic viral hepatitis C: Secondary | ICD-10-CM

## 2022-11-16 MED ORDER — MAVYRET 100-40 MG PO TABS
3.0000 | ORAL_TABLET | Freq: Every day | ORAL | 1 refills | Status: DC
  Filled 2022-11-16 – 2022-11-22 (×2): qty 84, 28d supply, fill #0
  Filled 2022-12-19: qty 84, 28d supply, fill #1

## 2022-11-16 NOTE — Telephone Encounter (Signed)
Lyndel Safe confirmed patient's Mavyret x 8 weeks is covered through [facility] with $4 copay. Sent prescription to [facility]. Will review medication with patient during his follow-up appointment with Dr. Juleen China next week on 4/4.   Alfonse Spruce, PharmD, CPP, BCIDP, [place] Clinical Pharmacist Practitioner Infectious [place] [facility]

## 2022-11-22 ENCOUNTER — Telehealth: Payer: Self-pay | Admitting: Pharmacist

## 2022-11-22 ENCOUNTER — Other Ambulatory Visit: Payer: Self-pay

## 2022-11-22 ENCOUNTER — Other Ambulatory Visit (HOSPITAL_COMMUNITY): Payer: Self-pay

## 2022-11-22 ENCOUNTER — Encounter: Payer: Self-pay | Admitting: Internal Medicine

## 2022-11-22 ENCOUNTER — Ambulatory Visit (INDEPENDENT_AMBULATORY_CARE_PROVIDER_SITE_OTHER): Payer: Medicaid Other | Admitting: Internal Medicine

## 2022-11-22 VITALS — BP 120/72 | HR 74 | Temp 98.3°F | Wt 158.6 lb

## 2022-11-22 DIAGNOSIS — B182 Chronic viral hepatitis C: Secondary | ICD-10-CM | POA: Diagnosis not present

## 2022-11-22 NOTE — Progress Notes (Signed)
[facility] for Infectious Disease  CHIEF COMPLAINT:    Follow up for hepatitis C  SUBJECTIVE:    Raymond Mckinney is a 42 y.o. male with PMHx as below who presents to the clinic for hepatitis C.   Patient here today to discuss HCV treatment.  Work up thus far showed:  HCV RNA 980 copies Genotype 1a Fibrosis F0 LFTs normal  Hep A not immune Hep B not immune  Please see A&P for the details of today's visit and status of the patient's medical problems.   Patient's Medications  New Prescriptions   No medications on file  Previous Medications   ASPIRIN-SALICYLAMIDE-CAFFEINE (BC HEADACHE POWDER PO)    Take by mouth as needed.   GLECAPREVIR-PIBRENTASVIR (MAVYRET) 100-40 MG TABS    Take 3 tablets by mouth daily with breakfast.  Modified Medications   No medications on file  Discontinued Medications   No medications on file      Past Medical History:  Diagnosis Date   Anxiety    Brain injury 08/2015   Depression    Hypertension    Seizures    08/2015    Social History   Tobacco Use   Smoking status: Every Day    Packs/day: 1.5    Types: Cigarettes   Smokeless tobacco: Never  Vaping Use   Vaping Use: Never used  Substance Use Topics   Alcohol use: Not Currently   Drug use: Not Currently    Types: Marijuana, Methamphetamines    Comment: meth last night- MJ once a month    Family History  Problem Relation Age of Onset   Dementia Father    Diabetes Sister     Allergies  Allergen Reactions   Shellfish Allergy Anaphylaxis and Hives   Penicillins    Penicillins Other (See Comments)    As child    Review of Systems  Constitutional: Negative.   Gastrointestinal: Negative.      OBJECTIVE:    Vitals:   11/22/22 1435  BP: 120/72  Pulse: 74  Temp: 98.3 F (36.8 C)  TempSrc: Temporal  SpO2: 99%  Weight: 158 lb 9.6 oz (71.9 kg)   Body mass index is 25.6 kg/m.  Physical Exam Constitutional:      Appearance: Normal appearance.   Eyes:     Extraocular Movements: Extraocular movements intact.     Conjunctiva/sclera: Conjunctivae normal.  Abdominal:     General: There is no distension.     Palpations: Abdomen is soft.  Musculoskeletal:     Cervical back: Normal range of motion and neck supple.  Skin:    General: Skin is warm and dry.  Neurological:     General: No focal deficit present.     Mental Status: He is alert and oriented to person, place, and time.  Psychiatric:        Mood and Affect: Mood normal.        Behavior: Behavior normal.      Labs and Microbiology:    Latest Ref Rng & Units 11/06/2022    3:17 AM 10/05/2022    9:47 AM 06/17/2015    8:21 AM  CBC  WBC 3.8 - 10.8 Thousand/uL 7.2  8.0  7.2   Hemoglobin 13.2 - 17.1 g/dL 16.9  17.0  15.0   Hematocrit 38.5 - 50.0 % 49.2  49.5  44.4   Platelets 140 - 400 Thousand/uL 363  369  315  Latest Ref Rng & Units 11/06/2022    3:17 AM 10/05/2022    9:47 AM 06/17/2015    8:21 AM  CMP  Glucose 65 - 99 mg/dL 85  78  145   BUN 7 - 25 mg/dL 17  13  11    Creatinine 0.60 - 1.29 mg/dL 1.06  1.18  0.98   Sodium 135 - 146 mmol/L 138  141  141   Potassium 3.5 - 5.3 mmol/L 4.0  4.7  4.8   Chloride 98 - 110 mmol/L 105  103  103   CO2 20 - 32 mmol/L 24  23  20    Calcium 8.6 - 10.3 mg/dL 9.8  10.0  9.5   Total Protein 6.1 - 8.1 g/dL 7.7  7.3  6.5   Total Bilirubin 0.2 - 1.2 mg/dL 0.7  0.2  0.3   Alkaline Phos 44 - 121 IU/L  77  57   AST 10 - 40 U/L 19  16  19    ALT 9 - 46 U/L 9 - 46 U/L 15    15  18  12        ASSESSMENT & PLAN:    Chronic hepatitis C without hepatic coma (HCC) Patient here today to get started on HCV therapy with Mavyret x 8 weeks.  Discussed with patient how to take medication properly and he also met with pharmacy.  He has pre-treatment Fibrosis score F0 so will not need prolonged liver monitoring following treatment.  Follow up in 4 weeks with myself or pharmacy.  Also advised hepatitis A and B vaccination and he declines  today.     Raynelle High[facility] for Infectious [facility] 11/22/2022, 2:46 PM

## 2022-11-22 NOTE — Telephone Encounter (Signed)
Patient is approved to receive Mavyret x 8 weeks for chronic Hepatitis C infection. Counseled patient to take all three tablets of Mavyret daily with food.  Counseled patient the need to take all three tablets together and to not separate them out during the day. Encouraged patient not to miss any doses and explained how their chance of cure could go down with each dose missed. Counseled patient on what to do if dose is missed - if it is closer to the missed dose take immediately; if closer to next dose then skip dose and take the next dose at the usual time. Counseled patient on common side effects such as headache, fatigue, and nausea and that these normally decrease with time. I reviewed patient medications and found no drug interactions. Discussed with patient that there are several drug interactions with Mavyret and instructed patient to call the clinic if him wishes to start a new medication during course of therapy. Also advised patient to call if he experiences any side effects. Patient will follow-up with me in the pharmacy clinic on 5/2.  Alfonse Spruce, PharmD, CPP, BCIDP, [place] Clinical Pharmacist Practitioner Infectious [place] for Infect[facility] Disease

## 2022-11-22 NOTE — Assessment & Plan Note (Signed)
Patient here today to get started on HCV therapy with Mavyret x 8 weeks.  Discussed with patient how to take medication properly and he also met with pharmacy.  He has pre-treatment Fibrosis score F0 so will not need prolonged liver monitoring following treatment.  Follow up in 4 weeks with myself or pharmacy.  Also advised hepatitis A and B vaccination and he declines today.

## 2022-11-23 ENCOUNTER — Telehealth: Payer: Self-pay

## 2022-11-23 NOTE — Telephone Encounter (Signed)
[facility] Patient Advocate Encounter  Patient's medications have been couriered to [facility] from [facility] and will be picked up 11/22/22.  Mavyret 1st box  Clearance Coots , CPhT Specialty Pharmacy Patient [facility] for Infectious Disease Phone: [phone] Fax:  [phone]

## 2022-12-08 NOTE — Progress Notes (Unsigned)
Referring Provider:Milian, Aleen Campi, FNP  Primary Care Physician:  Arrie Senate, FNP Primary Gastroenterologist:  Dr. Jena Gauss  Chief Complaint  Patient presents with   Colonoscopy    Colonoscopy screening. Bleeding sometimes and constipation.     HPI:   Raymond Mckinney is a 42 y.o. male presenting today at the request of Milian, Aleen Campi, FNP for colon cancer screening.  Due to history of drug use, recent chest pain complaint with PCP, needed office visit to discuss scheduling.  Reviewed office visit with PCP dated 10/05/2022.  Patient reported palpitations, shortness of breath, pain.  He had an EKG with slight T wave variation.  Planned for long-term monitor.  Also plan to update blood work.  Labs including CBC, CMP, TSH within normal limits.  Hepatitis C antibody reactive, RNA 370.  He was referred to ID.  Heart monitor was completed with no significant abnormalities.  He was found to have hepatitis C genotype Ia.  FibroTest corresponding to F0.  Plan was to start Mavyret x 8 weeks 08/23/2022 per ID.  Today:  10 episodes of rectal bleeding in the last 2 years. Red. Can be on tissue or in stool. No rectal pain or known hemorrhoids. Has had some bowel irregularity since starting Mavyret. BMs every 2 days. No hard stools. No abdominal pain. Gong in to week 4 of Mavyret. No nausea, vomiting, heartburn, or dysphagia. No unintentional weight loss.   Great grandfather, grandfather, and uncle on mothers side with colon cancer. Under age 45 - 61, except great grandfather.   Reports history of drug use with drugs placed rectally and history of a bag busting in the past.  No drug use since December 2021.  Past Medical History:  Diagnosis Date   Anxiety    Brain injury 08/2015   Depression    Hypertension    Seizures    08/2015    Past Surgical History:  Procedure Laterality Date   HAND SURGERY     OTHER SURGICAL HISTORY Left Hand surgery    Current Outpatient  Medications  Medication Sig Dispense Refill   Aspirin-Salicylamide-Caffeine (BC HEADACHE POWDER PO) Take by mouth as needed.     Glecaprevir-Pibrentasvir (MAVYRET) 100-40 MG TABS Take 3 tablets by mouth daily with breakfast. 84 tablet 1   No current facility-administered medications for this visit.    Allergies as of 12/10/2022 - Review Complete 12/10/2022  Allergen Reaction Noted   Shellfish allergy Anaphylaxis and Hives 11/08/2014   Penicillins  08/17/2011   Penicillins Other (See Comments) 12/20/2014    Family History  Problem Relation Age of Onset   Dementia Father    Diabetes Sister     Social History   Socioeconomic History   Marital status: Married    Spouse name: Not on file   Number of children: 5   Years of education: GED   Highest education level: Not on file  Occupational History    Comment: [employer]  Tobacco Use   Smoking status: Every Day    Packs/day: 1.5    Types: Cigarettes   Smokeless tobacco: Never  Vaping Use   Vaping Use: Never used  Substance and Sexual Activity   Alcohol use: Not Currently   Drug use: Not Currently    Types: Marijuana, Methamphetamines    Comment: meth last night- MJ once a month- Last used December 30th 2021.   Sexual activity: Not on file  Other Topics Concern   Not on file  Social History  Narrative   Lives with family   Caffeine- Mtn Dew once a day, lots of tea   Social Determinants of Health   Financial Resource Strain: Not on file  Food Insecurity: Not on file  Transportation Needs: Not on file  Physical Activity: Not on file  Stress: Not on file  Social Connections: Not on file  Intimate Partner Violence: Not on file    Review of Systems: Gen: Denies any fever, chills, cold or flulike symptoms, presyncope, syncope. CV: Denies chest pain, heart palpitations. Resp: Denies shortness of breath, cough. GI: See HPI GU : Denies urinary burning, urinary frequency, urinary hesitancy MS: Denies joint  pain. Derm: Denies rash. Psych: Denies depression, anxiety. Heme: See HPI  Physical Exam: BP 120/73 (BP Location: Left Arm, Patient Position: Sitting, Cuff Size: Large)   Pulse 80   Temp 98.6 F (37 C) (Temporal)   Ht  (1.676 m)   Wt 162 lb 3.2 oz (73.6 kg)   SpO2 98%   BMI 26.18 kg/m  General:   Alert and oriented. Pleasant and cooperative. Well-nourished and well-developed.  Head:  Normocephalic and atraumatic. Eyes:  Without icterus, sclera clear and conjunctiva pink.  Ears:  Normal auditory acuity. Lungs:  Clear to auscultation bilaterally. No wheezes, rales, or rhonchi. No distress.  Heart:  S1, S2 present without murmurs appreciated.  Abdomen:  +BS, soft, non-tender and non-distended. No HSM noted. No guarding or rebound. No masses appreciated.  Rectal:  Deferred  Msk:  Symmetrical without gross deformities. Normal posture. Extremities:  Without edema. Neurologic:  Alert and  oriented x4;  grossly normal neurologically. Skin:  Intact without significant lesions or rashes. Psych: Normal mood and affect.    Assessment:  42 year old male with history of anxiety, depression, HTN, seizures in the setting of drug use, now abstinent since December 2021, hepatitis C now on Mavyret per ID with no evidence of liver fibrosis, presenting today for further evaluation of rectal bleeding.  Reports 10 episodes of rectal bleeding in the last 2 years.  Denies rectal pain, known hemorrhoids, unintentional weight loss.  He has recently had some bowel irregularity since starting Mavyret.  No other significant GI symptoms.  He does report history of placing drugs rectally with a bag busting in the past.  Family history significant for maternal great grandfather, paternal grandfather, and maternal uncle all with colon cancer.  Maternal grandfather and uncle were diagnosed before age 37.  Patient needs a colonoscopy for further evaluation.  Etiology of bleeding is unknown at this  time.   Plan:  Proceed with colonoscopy with propofol by Dr. Jena Gauss in near future. The risks, benefits, and alternatives have been discussed with the patient in detail. The patient states understanding and desires to proceed.  ASA 2 UDS prior Start Benefiber 2 teaspoons daily x 2 weeks, then increase to twice daily. If needed, may take Colace 100 mg daily or MiraLAX 17 g daily. Follow-up per Dr. Luvenia Starch recommendations.    Ermalinda Memos, PA-C [facility] 12/10/2022

## 2022-12-08 NOTE — H&P (View-Only) (Signed)
Referring Provider:Milian, Aleen Campi, FNP  Primary Care Physician:  Arrie Senate, FNP Primary Gastroenterologist:  Dr. Jena Gauss  Chief Complaint  Patient presents with   Colonoscopy    Colonoscopy screening. Bleeding sometimes and constipation.     HPI:   Raymond Mckinney is a 42 y.o. male presenting today at the request of Milian, Aleen Campi, FNP for colon cancer screening.  Due to history of drug use, recent chest pain complaint with PCP, needed office visit to discuss scheduling.  Reviewed office visit with PCP dated 10/05/2022.  Patient reported palpitations, shortness of breath, pain.  He had an EKG with slight T wave variation.  Planned for long-term monitor.  Also plan to update blood work.  Labs including CBC, CMP, TSH within normal limits.  Hepatitis C antibody reactive, RNA 370.  He was referred to ID.  Heart monitor was completed with no significant abnormalities.  He was found to have hepatitis C genotype Ia.  FibroTest corresponding to F0.  Plan was to start Mavyret x 8 weeks 08/23/2022 per ID.  Today:  10 episodes of rectal bleeding in the last 2 years. Red. Can be on tissue or in stool. No rectal pain or known hemorrhoids. Has had some bowel irregularity since starting Mavyret. BMs every 2 days. No hard stools. No abdominal pain. Gong in to week 4 of Mavyret. No nausea, vomiting, heartburn, or dysphagia. No unintentional weight loss.   Great grandfather, grandfather, and uncle on mothers side with colon cancer. Under age 45 - 61, except great grandfather.   Reports history of drug use with drugs placed rectally and history of a bag busting in the past.  No drug use since December 2021.  Past Medical History:  Diagnosis Date   Anxiety    Brain injury 08/2015   Depression    Hypertension    Seizures    08/2015    Past Surgical History:  Procedure Laterality Date   HAND SURGERY     OTHER SURGICAL HISTORY Left Hand surgery    Current Outpatient  Medications  Medication Sig Dispense Refill   Aspirin-Salicylamide-Caffeine (BC HEADACHE POWDER PO) Take by mouth as needed.     Glecaprevir-Pibrentasvir (MAVYRET) 100-40 MG TABS Take 3 tablets by mouth daily with breakfast. 84 tablet 1   No current facility-administered medications for this visit.    Allergies as of 12/10/2022 - Review Complete 12/10/2022  Allergen Reaction Noted   Shellfish allergy Anaphylaxis and Hives 11/08/2014   Penicillins  08/17/2011   Penicillins Other (See Comments) 12/20/2014    Family History  Problem Relation Age of Onset   Dementia Father    Diabetes Sister     Social History   Socioeconomic History   Marital status: Married    Spouse name: Not on file   Number of children: 5   Years of education: GED   Highest education level: Not on file  Occupational History    Comment: [employer]  Tobacco Use   Smoking status: Every Day    Packs/day: 1.5    Types: Cigarettes   Smokeless tobacco: Never  Vaping Use   Vaping Use: Never used  Substance and Sexual Activity   Alcohol use: Not Currently   Drug use: Not Currently    Types: Marijuana, Methamphetamines    Comment: meth last night- MJ once a month- Last used December 30th 2021.   Sexual activity: Not on file  Other Topics Concern   Not on file  Social History  Narrative   Lives with family   Caffeine- Mtn Dew once a day, lots of tea   Social Determinants of Health   Financial Resource Strain: Not on file  Food Insecurity: Not on file  Transportation Needs: Not on file  Physical Activity: Not on file  Stress: Not on file  Social Connections: Not on file  Intimate Partner Violence: Not on file    Review of Systems: Gen: Denies any fever, chills, cold or flulike symptoms, presyncope, syncope. CV: Denies chest pain, heart palpitations. Resp: Denies shortness of breath, cough. GI: See HPI GU : Denies urinary burning, urinary frequency, urinary hesitancy MS: Denies joint  pain. Derm: Denies rash. Psych: Denies depression, anxiety. Heme: See HPI  Physical Exam: BP 120/73 (BP Location: Left Arm, Patient Position: Sitting, Cuff Size: Large)   Pulse 80   Temp 98.6 F (37 C) (Temporal)   Ht  (1.676 m)   Wt 162 lb 3.2 oz (73.6 kg)   SpO2 98%   BMI 26.18 kg/m  General:   Alert and oriented. Pleasant and cooperative. Well-nourished and well-developed.  Head:  Normocephalic and atraumatic. Eyes:  Without icterus, sclera clear and conjunctiva pink.  Ears:  Normal auditory acuity. Lungs:  Clear to auscultation bilaterally. No wheezes, rales, or rhonchi. No distress.  Heart:  S1, S2 present without murmurs appreciated.  Abdomen:  +BS, soft, non-tender and non-distended. No HSM noted. No guarding or rebound. No masses appreciated.  Rectal:  Deferred  Msk:  Symmetrical without gross deformities. Normal posture. Extremities:  Without edema. Neurologic:  Alert and  oriented x4;  grossly normal neurologically. Skin:  Intact without significant lesions or rashes. Psych: Normal mood and affect.    Assessment:  42 year old male with history of anxiety, depression, HTN, seizures in the setting of drug use, now abstinent since December 2021, hepatitis C now on Mavyret per ID with no evidence of liver fibrosis, presenting today for further evaluation of rectal bleeding.  Reports 10 episodes of rectal bleeding in the last 2 years.  Denies rectal pain, known hemorrhoids, unintentional weight loss.  He has recently had some bowel irregularity since starting Mavyret.  No other significant GI symptoms.  He does report history of placing drugs rectally with a bag busting in the past.  Family history significant for maternal great grandfather, paternal grandfather, and maternal uncle all with colon cancer.  Maternal grandfather and uncle were diagnosed before age 37.  Patient needs a colonoscopy for further evaluation.  Etiology of bleeding is unknown at this  time.   Plan:  Proceed with colonoscopy with propofol by Dr. Jena Gauss in near future. The risks, benefits, and alternatives have been discussed with the patient in detail. The patient states understanding and desires to proceed.  ASA 2 UDS prior Start Benefiber 2 teaspoons daily x 2 weeks, then increase to twice daily. If needed, may take Colace 100 mg daily or MiraLAX 17 g daily. Follow-up per Dr. Luvenia Starch recommendations.    Ermalinda Memos, PA-C [facility] 12/10/2022

## 2022-12-10 ENCOUNTER — Ambulatory Visit: Payer: Medicaid Other | Admitting: Gastroenterology

## 2022-12-10 ENCOUNTER — Encounter: Payer: Self-pay | Admitting: Gastroenterology

## 2022-12-10 VITALS — BP 120/73 | HR 80 | Temp 98.6°F | Ht 66.0 in | Wt 162.2 lb

## 2022-12-10 DIAGNOSIS — K625 Hemorrhage of anus and rectum: Secondary | ICD-10-CM | POA: Diagnosis not present

## 2022-12-10 DIAGNOSIS — Z800 Family history of malignant neoplasm of digestive organs: Secondary | ICD-10-CM

## 2022-12-10 NOTE — Patient Instructions (Signed)
We will range to have a colonoscopy in the near future with Dr. Jena Gauss.  To help with bowel regularity, start Benefiber 2 teaspoons daily x 2 weeks, then increase to twice daily.  If needed, you can take a stool softener such as Colace (docusate sodium) 100 mg daily, or MiraLAX 17 g daily.  These medications are safe to take daily if needed and also safe to take with Mavyret.  Will follow-up with you in the office after your colonoscopy as Dr. Jena Gauss recommends.  Do not hesitate to call if you have questions or concerns.  Ermalinda Memos, PA-C [facility]

## 2022-12-11 ENCOUNTER — Encounter: Payer: Self-pay | Admitting: Gastroenterology

## 2022-12-11 ENCOUNTER — Telehealth: Payer: Self-pay | Admitting: *Deleted

## 2022-12-11 DIAGNOSIS — K625 Hemorrhage of anus and rectum: Secondary | ICD-10-CM

## 2022-12-11 DIAGNOSIS — Z800 Family history of malignant neoplasm of digestive organs: Secondary | ICD-10-CM

## 2022-12-11 NOTE — Telephone Encounter (Signed)
Called pt, no answer and not able to leave VM, VM full, to schedule TCS with Dr. Jena Gauss, ASA 2, UDS prior

## 2022-12-12 NOTE — Telephone Encounter (Signed)
Called pt, no answer and no VM, WCB 

## 2022-12-17 MED ORDER — CLENPIQ 10-3.5-12 MG-GM -GM/175ML PO SOLN: 1.0000 | ORAL | 0 refills | Status: DC

## 2022-12-18 ENCOUNTER — Other Ambulatory Visit (HOSPITAL_COMMUNITY)
Admission: RE | Admit: 2022-12-18 | Discharge: 2022-12-18 | Disposition: A | Discharge status: Home / Self Care | Payer: Medicaid Other | Source: Ambulatory Visit | Attending: Internal Medicine | Admitting: Internal Medicine

## 2022-12-18 DIAGNOSIS — Z800 Family history of malignant neoplasm of digestive organs: Secondary | ICD-10-CM | POA: Insufficient documentation

## 2022-12-18 DIAGNOSIS — K625 Hemorrhage of anus and rectum: Secondary | ICD-10-CM

## 2022-12-18 LAB — RAPID URINE DRUG SCREEN, HOSP PERFORMED
Amphetamines: NOT DETECTED
Barbiturates: NOT DETECTED
Benzodiazepines: NOT DETECTED
Cocaine: NOT DETECTED
Opiates: NOT DETECTED
Tetrahydrocannabinol: NOT DETECTED

## 2022-12-19 ENCOUNTER — Other Ambulatory Visit (HOSPITAL_COMMUNITY): Payer: Self-pay

## 2022-12-19 ENCOUNTER — Encounter (HOSPITAL_COMMUNITY)
Admission: RE | Admit: 2022-12-19 | Discharge: 2022-12-19 | Disposition: A | Discharge status: Home / Self Care | Payer: Medicaid Other | Source: Ambulatory Visit | Attending: Internal Medicine | Admitting: Internal Medicine

## 2022-12-20 ENCOUNTER — Other Ambulatory Visit: Payer: Self-pay

## 2022-12-20 ENCOUNTER — Ambulatory Visit (HOSPITAL_COMMUNITY)
Admission: RE | Admit: 2022-12-20 | Discharge: 2022-12-20 | Disposition: A | Discharge status: Home / Self Care | Payer: Medicaid Other | Attending: Internal Medicine | Admitting: Internal Medicine

## 2022-12-20 ENCOUNTER — Telehealth: Payer: Self-pay

## 2022-12-20 ENCOUNTER — Ambulatory Visit (HOSPITAL_COMMUNITY): Payer: Medicaid Other | Admitting: Certified Registered Nurse Anesthetist

## 2022-12-20 ENCOUNTER — Ambulatory Visit (HOSPITAL_BASED_OUTPATIENT_CLINIC_OR_DEPARTMENT_OTHER): Payer: Medicaid Other | Admitting: Certified Registered Nurse Anesthetist

## 2022-12-20 ENCOUNTER — Ambulatory Visit: Payer: Medicaid Other | Admitting: Pharmacist

## 2022-12-20 ENCOUNTER — Encounter (HOSPITAL_COMMUNITY)
Admission: RE | Disposition: A | Discharge status: Home / Self Care | Payer: Self-pay | Source: Home / Self Care | Attending: Internal Medicine

## 2022-12-20 ENCOUNTER — Encounter (HOSPITAL_COMMUNITY): Payer: Self-pay | Admitting: Internal Medicine

## 2022-12-20 DIAGNOSIS — Z79899 Other long term (current) drug therapy: Secondary | ICD-10-CM | POA: Insufficient documentation

## 2022-12-20 DIAGNOSIS — K921 Melena: Secondary | ICD-10-CM

## 2022-12-20 DIAGNOSIS — B1920 Unspecified viral hepatitis C without hepatic coma: Secondary | ICD-10-CM | POA: Insufficient documentation

## 2022-12-20 DIAGNOSIS — F17210 Nicotine dependence, cigarettes, uncomplicated: Secondary | ICD-10-CM | POA: Diagnosis not present

## 2022-12-20 DIAGNOSIS — I10 Essential (primary) hypertension: Secondary | ICD-10-CM

## 2022-12-20 DIAGNOSIS — B182 Chronic viral hepatitis C: Secondary | ICD-10-CM | POA: Diagnosis not present

## 2022-12-20 DIAGNOSIS — F418 Other specified anxiety disorders: Secondary | ICD-10-CM

## 2022-12-20 DIAGNOSIS — F419 Anxiety disorder, unspecified: Secondary | ICD-10-CM | POA: Insufficient documentation

## 2022-12-20 DIAGNOSIS — K625 Hemorrhage of anus and rectum: Secondary | ICD-10-CM

## 2022-12-20 DIAGNOSIS — F32A Depression, unspecified: Secondary | ICD-10-CM | POA: Insufficient documentation

## 2022-12-20 DIAGNOSIS — Z800 Family history of malignant neoplasm of digestive organs: Secondary | ICD-10-CM | POA: Diagnosis not present

## 2022-12-20 HISTORY — PX: COLONOSCOPY WITH PROPOFOL: SHX5780

## 2022-12-20 SURGERY — COLONOSCOPY WITH PROPOFOL
Anesthesia: General

## 2022-12-20 MED ORDER — STERILE WATER FOR IRRIGATION IR SOLN
Status: DC | PRN
  Administered 2022-12-20: 60 mL

## 2022-12-20 MED ORDER — PROPOFOL 10 MG/ML IV BOLUS
INTRAVENOUS | Status: DC | PRN
  Administered 2022-12-20: 50 mg via INTRAVENOUS
  Administered 2022-12-20: 100 mg via INTRAVENOUS

## 2022-12-20 MED ORDER — LIDOCAINE 2% (20 MG/ML) 5 ML SYRINGE
INTRAMUSCULAR | Status: DC | PRN
  Administered 2022-12-20: 60 mg via INTRAVENOUS

## 2022-12-20 MED ORDER — LACTATED RINGERS IV SOLN: INTRAVENOUS | Status: DC | PRN

## 2022-12-20 MED ORDER — PROPOFOL 500 MG/50ML IV EMUL
INTRAVENOUS | Status: DC | PRN
  Administered 2022-12-20: 250 ug/kg/min via INTRAVENOUS

## 2022-12-20 NOTE — Telephone Encounter (Signed)
[facility] Patient Advocate Encounter  Patient's medications have been couriered to [facility] from [facility] and will be picked up .  2nd Mavyret box  Teka Chanda , CPhT Specialty Pharmacy Patient [facility] Phone: [phone] Fax:  [phone]  

## 2022-12-20 NOTE — Interval H&P Note (Signed)
History and Physical Interval Note:  12/20/2022 12:17 PM  Raymond Mckinney  has presented today for surgery, with the diagnosis of rectal bleeding.  The various methods of treatment have been discussed with the patient and family. After consideration of risks, benefits and other options for treatment, the patient has consented to  Procedure(s) with comments: COLONOSCOPY WITH PROPOFOL (N/A) - 1:00pm, asa 2 as a surgical intervention.  The patient's history has been reviewed, patient examined, no change in status, stable for surgery.  I have reviewed the patient's chart and labs.  Questions were answered to the patient's satisfaction.     Raymond Mckinney   intermittent rectal bleeding.  First ever colonoscopy diagnostic colonoscopy today per plan.  The risks, benefits, limitations, alternatives and imponderables have been reviewed with the patient. Questions have been answered. All parties are agreeable.

## 2022-12-20 NOTE — Anesthesia Preprocedure Evaluation (Signed)
Anesthesia Evaluation  Patient identified by MRN, date of birth, ID band Patient awake    Reviewed: Allergy & Precautions, H&P , NPO status , Patient's Chart, lab work & pertinent test results, reviewed documented beta blocker date and time   Airway Mallampati: II  TM Distance: >3 FB Neck ROM: full    Dental no notable dental hx.    Pulmonary neg pulmonary ROS, Current Smoker   Pulmonary exam normal breath sounds clear to auscultation       Cardiovascular Exercise Tolerance: Good hypertension, negative cardio ROS  Rhythm:regular Rate:Normal     Neuro/Psych Seizures -,  PSYCHIATRIC DISORDERS Anxiety Depression    negative neurological ROS  negative psych ROS   GI/Hepatic negative GI ROS, Neg liver ROS,,,(+) Hepatitis -  Endo/Other  negative endocrine ROS    Renal/GU negative Renal ROS  negative genitourinary   Musculoskeletal   Abdominal   Peds  Hematology negative hematology ROS (+)   Anesthesia Other Findings   Reproductive/Obstetrics negative OB ROS                             Anesthesia Physical Anesthesia Plan  ASA: 3  Anesthesia Plan: General   Post-op Pain Management:    Induction:   PONV Risk Score and Plan: Propofol infusion  Airway Management Planned:   Additional Equipment:   Intra-op Plan:   Post-operative Plan:   Informed Consent: I have reviewed the patients History and Physical, chart, labs and discussed the procedure including the risks, benefits and alternatives for the proposed anesthesia with the patient or authorized representative who has indicated his/her understanding and acceptance.     Dental Advisory Given  Plan Discussed with: CRNA  Anesthesia Plan Comments:        Anesthesia Quick Evaluation

## 2022-12-20 NOTE — Discharge Instructions (Signed)
  Colonoscopy Discharge Instructions  Read the instructions outlined below and refer to this sheet in the next few weeks. These discharge instructions provide you with general information on caring for yourself after you leave the hospital. Your doctor may also give you specific instructions. While your treatment has been planned according to the most current medical practices available, unavoidable complications occasionally occur. If you have any problems or questions after discharge, call Dr. Jena Gauss at [phone]. ACTIVITY You may resume your regular activity, but move at a slower pace for the next 24 hours.  Take frequent rest periods for the next 24 hours.  Walking will help get rid of the air and reduce the bloated feeling in your belly (abdomen).  No driving for 24 hours (because of the medicine (anesthesia) used during the test).   Do not sign any important legal documents or operate any machinery for 24 hours (because of the anesthesia used during the test).  NUTRITION Drink plenty of fluids.  You may resume your normal diet as instructed by your doctor.  Begin with a light meal and progress to your normal diet. Heavy or fried foods are harder to digest and may make you feel sick to your stomach (nauseated).  Avoid alcoholic beverages for 24 hours or as instructed.  MEDICATIONS You may resume your normal medications unless your doctor tells you otherwise.  WHAT YOU CAN EXPECT TODAY Some feelings of bloating in the abdomen.  Passage of more gas than usual.  Spotting of blood in your stool or on the toilet paper.  IF YOU HAD POLYPS REMOVED DURING THE COLONOSCOPY: No aspirin products for 7 days or as instructed.  No alcohol for 7 days or as instructed.  Eat a soft diet for the next 24 hours.  FINDING OUT THE RESULTS OF YOUR TEST Not all test results are available during your visit. If your test results are not back during the visit, make an appointment with your caregiver to find out the  results. Do not assume everything is normal if you have not heard from your caregiver or the medical facility. It is important for you to follow up on all of your test results.  SEEK IMMEDIATE MEDICAL ATTENTION IF: You have more than a spotting of blood in your stool.  Your belly is swollen (abdominal distention).  You are nauseated or vomiting.  You have a temperature over 101.  You have abdominal pain or discomfort that is severe or gets worse throughout the day.      Your rectum and colon appeared normal  I just suspect benign irritation as a cause of your bleeding  Continue to avoid constipation as previously recommended  Recommend repeat colonoscopy in 5 years for screening given family history.   at patient request, I called Junious Dresser at [phone] -  rolled to voicemail "voicemail full"

## 2022-12-20 NOTE — Progress Notes (Addendum)
HPI: Raymond Mckinney is a 42 y.o. male who presents to the [facility] pharmacy clinic for Hepatitis C follow-up.  Medication: Mavyret x 8 weeks   Start Date: 11/22/22  Hepatitis C Genotype: 1a  Fibrosis Score: F0  Hepatitis C RNA: 980  Patient Active Problem List   Diagnosis Date Noted   Rectal bleeding 12/10/2022   Family history of colon cancer 12/10/2022   Chronic hepatitis C without hepatic coma (HCC) 11/06/2022   Seizure disorder (HCC) 10/05/2022   History of methamphetamine abuse (HCC) 08/21/2020   GAD (generalized anxiety disorder) 05/27/2015    Patient's Medications  New Prescriptions   No medications on file  Previous Medications   ASPIRIN-SALICYLAMIDE-CAFFEINE (BC HEADACHE POWDER PO)    Take 1 Package by mouth as needed (Headache).   GLECAPREVIR-PIBRENTASVIR (MAVYRET) 100-40 MG TABS    Take 3 tablets by mouth daily with breakfast.   PSYLLIUM (METAMUCIL) 58.6 % POWDER    Take 1 packet by mouth daily. 30 ml   SOD PICOSULFATE-MAG OX-CIT ACD (CLENPIQ) 10-3.5-12 MG-GM -GM/175ML SOLN    Take 1 kit by mouth as directed.  Modified Medications   No medications on file  Discontinued Medications   No medications on file    Allergies: Allergies  Allergen Reactions   Shellfish Allergy Anaphylaxis and Hives   Penicillins Other (See Comments)    As child    Past Medical History: Past Medical History:  Diagnosis Date   Anxiety    Brain injury (HCC) 08/2015   Depression    Hypertension    Seizures (HCC)    08/2015    Social History: Social History   Socioeconomic History   Marital status: Married    Spouse name: Not on file   Number of children: 5   Years of education: GED   Highest education level: Not on file  Occupational History    Comment: [employer]  Tobacco Use   Smoking status: Every Day    Packs/day: 1.5    Types: Cigarettes   Smokeless tobacco: Never  Vaping Use   Vaping Use: Never used  Substance and Sexual Activity   Alcohol use: Not  Currently   Drug use: Not Currently    Types: Marijuana, Methamphetamines    Comment: meth last night- MJ once a month- Last used December 30th 2021.   Sexual activity: Not on file  Other Topics Concern   Not on file  Social History Narrative   Lives with family   Caffeine- Mtn Dew once a day, lots of tea   Social Determinants of Health   Financial Resource Strain: Not on file  Food Insecurity: Not on file  Transportation Needs: Not on file  Physical Activity: Not on file  Stress: Not on file  Social Connections: Not on file    Labs: Hepatitis C Lab Results  Component Value Date   HCVGENOTYPE 1a 11/06/2022   HCVRNAPCRQN 980 (H) 11/06/2022   FIBROSTAGE F0 11/06/2022   Hepatitis B Lab Results  Component Value Date   HEPBSAB NON-REACTIVE 11/06/2022   HEPBSAG NON-REACTIVE 11/06/2022   HEPBCAB NON-REACTIVE 11/06/2022   Hepatitis A Lab Results  Component Value Date   HAV NON-REACTIVE 11/06/2022   HIV Lab Results  Component Value Date   HIV Non Reactive 10/19/2022   HIV Non Reactive 06/17/2015   Lab Results  Component Value Date   CREATININE 1.06 11/06/2022   CREATININE 1.18 10/05/2022   CREATININE 0.98 06/17/2015   CREATININE 1.40 (H) 12/20/2014  CREATININE 1.03 12/20/2014   Lab Results  Component Value Date   AST 19 11/06/2022   AST 16 10/05/2022   AST 19 06/17/2015   ALT 15 11/06/2022   ALT 15 11/06/2022   ALT 18 10/05/2022   INR 1.1 11/06/2022   INR 1.05 12/20/2014   INR 1.05 09/08/2014    Assessment: Raymond Mckinney presents to clinic today for HCV follow-up. He has been taking Mavyret for 4 weeks and is tolerating the medicine well. States he did experience some mild headaches within the first few days that continued through this month with lessening duration and intensity; he did not need to take medicine to relieve symptoms. Reviewed that these will likely resolve as he completes he second month of medicine. He has not missed any doses. Provided patient  with his second box of Mavyret today during his visit; he took his last pills yesterday evening with dinner. Will check HCV RNA today and follow-up in 4 weeks with Dr. Earlene Plater.  He politely declined hepatitis A and B vaccines today.    Plan: Check HCV RNA Continue Mavyret x 4 weeks Follow up with Dr. Earlene Plater on 6/5   Raymond Mckinney, PharmD, CPP, BCIDP, AAHIVP Clinical Pharmacist Practitioner Infectious Diseases Clinical Pharmacist [facility] 12/20/2022, 4:02 PM

## 2022-12-20 NOTE — Op Note (Signed)
[facility] Patient Name: Raymond Mckinney Procedure Date: 12/20/2022 11:45 AM MRN: [id number] Date of Birth: November 24, 1980 Attending MD: Gennette Pac , MD, [id number] CSN: [id number] Age: 42 Admit Type: Outpatient Procedure:                Colonoscopy Indications:              Hematochezia Providers:                Gennette Pac, MD, Angelica Ran, Pandora Leiter, Technician Referring MD:              Medicines:                Propofol per Anesthesia Complications:            No immediate complications. Estimated Blood Loss:     Estimated blood loss: none. Procedure:                Pre-Anesthesia Assessment:                           - Prior to the procedure, a History and Physical                            was performed, and patient medications and                            allergies were reviewed. The patient's tolerance of                            previous anesthesia was also reviewed. The risks                            and benefits of the procedure and the sedation                            options and risks were discussed with the patient.                            All questions were answered, and informed consent                            was obtained. Prior Anticoagulants: The patient has                            taken no anticoagulant or antiplatelet agents. ASA                            Grade Assessment: III - A patient with severe                            systemic disease. After reviewing the risks and                            benefits,  the patient was deemed in satisfactory                            condition to undergo the procedure.                           After obtaining informed consent, the colonoscope                            was passed under direct vision. Throughout the                            procedure, the patient's blood pressure, pulse, and                            oxygen saturations were monitored  continuously. The                            [id number] scope was introduced through the                            anus and advanced to the the cecum, identified by                            appendiceal orifice and ileocecal valve. The                            colonoscopy was performed without difficulty. The                            patient tolerated the procedure well. The quality                            of the bowel preparation was adequate. The                            ileocecal valve, appendiceal orifice, and rectum                            were photographed. The entire colon was well                            visualized. The entire colon was well visualized. Scope In: 12:32:35 PM Scope Out: 12:44:08 PM Scope Withdrawal Time: 0 hours 8 minutes 17 seconds  Total Procedure Duration: 0 hours 11 minutes 33 seconds  Findings:      The perianal and digital rectal examinations were normal.      The colon (entire examined portion) appeared normal.      The exam was otherwise without abnormality on direct and retroflexion       views. Impression:               - The entire examined colon is normal.                           -  The examination was otherwise normal on direct                            and retroflexion views.                           - No specimens collected. I suspect rectal bleeding                            from benign anorectal irritation. Moderate Sedation:      Moderate (conscious) sedation was personally administered by an       anesthesia professional. The following parameters were monitored: oxygen       saturation, heart rate, blood pressure, respiratory rate, EKG, adequacy       of pulmonary ventilation, and response to care. Recommendation:           - Patient has a contact number available for                            emergencies. The signs and symptoms of potential                            delayed complications were discussed with the                             patient. Return to normal activities tomorrow.                            Written discharge instructions were provided to the                            patient.                           - Advance diet as tolerated.                           - Continue present medications. Continue with good                            bowel regimen to avoid constipation as previously                            recommended.                           - Repeat colonoscopy in 5 years for screening                            purposes given family history..                           - Return to GI office PRN. Procedure Code(s):        --- Professional ---  04540, Colonoscopy, flexible; diagnostic, including                            collection of specimen(s) by brushing or washing,                            when performed (separate procedure) Diagnosis Code(s):        --- Professional ---                           K92.1, Melena (includes Hematochezia) CPT copyright 2022 American Medical Association. All rights reserved. The codes documented in this report are preliminary and upon coder review may  be revised to meet current compliance requirements. [name]. [name], MD Gennette Pac, MD 12/20/2022 1:00:28 PM This report has been signed electronically. Number of Addenda: 0

## 2022-12-20 NOTE — Transfer of Care (Signed)
Immediate Anesthesia Transfer of Care Note  Patient: Raymond Mckinney  Procedure(s) Performed: COLONOSCOPY WITH PROPOFOL  Patient Location: PACU  Anesthesia Type:General  Level of Consciousness: sedated  Airway & Oxygen Therapy: Patient Spontanous Breathing  Post-op Assessment: Report given to RN and Post -op Vital signs reviewed and stable  Post vital signs: Reviewed and stable  Last Vitals:  Vitals Value Taken Time  BP    Temp    Pulse    Resp    SpO2      Last Pain:  Vitals:   12/20/22 1226  TempSrc:   PainSc: 0-No pain         Complications: No notable events documented.

## 2022-12-22 LAB — HEPATITIS C RNA QUANTITATIVE
HCV Quantitative Log: <1.18 log IU/mL
HCV RNA, PCR, QN: <15 IU/mL

## 2022-12-23 NOTE — Anesthesia Postprocedure Evaluation (Signed)
Anesthesia Post Note  Patient: Raymond Mckinney  Procedure(s) Performed: COLONOSCOPY WITH PROPOFOL  Patient location during evaluation: Phase II Anesthesia Type: General Level of consciousness: awake Pain management: pain level controlled Vital Signs Assessment: post-procedure vital signs reviewed and stable Respiratory status: spontaneous breathing and respiratory function stable Cardiovascular status: blood pressure returned to baseline and stable Postop Assessment: no headache and no apparent nausea or vomiting Anesthetic complications: no Comments: Late entry   No notable events documented.   Last Vitals:  Vitals:   12/20/22 1251 12/20/22 1254  BP: (!) 76/49 97/62  Pulse: 66 63  Resp: 14   Temp: (!) 36.4 C   SpO2: 97% 100%    Last Pain:  Vitals:   12/21/22 1423  TempSrc:   PainSc: 0-No pain                 Windell Norfolk

## 2022-12-27 ENCOUNTER — Encounter (HOSPITAL_COMMUNITY): Payer: Self-pay | Admitting: Internal Medicine

## 2023-01-15 ENCOUNTER — Other Ambulatory Visit: Payer: Self-pay

## 2023-01-15 ENCOUNTER — Other Ambulatory Visit (HOSPITAL_COMMUNITY): Payer: Self-pay

## 2023-01-23 ENCOUNTER — Ambulatory Visit: Payer: Medicaid Other | Admitting: Internal Medicine

## 2023-01-23 ENCOUNTER — Encounter: Payer: Self-pay | Admitting: Family

## 2023-01-23 ENCOUNTER — Ambulatory Visit: Payer: Medicaid Other | Admitting: Family

## 2023-01-23 ENCOUNTER — Other Ambulatory Visit: Payer: Self-pay

## 2023-01-23 VITALS — BP 117/80 | HR 79 | Temp 98.0°F | Resp 16 | Wt 162.2 lb

## 2023-01-23 DIAGNOSIS — B182 Chronic viral hepatitis C: Secondary | ICD-10-CM | POA: Diagnosis not present

## 2023-01-23 NOTE — Patient Instructions (Addendum)
Nice to see you.  Check lab work today.   Plan for follow up in 3 months or sooner if needed with lab work on the same day.  Have a great day and stay safe!

## 2023-01-23 NOTE — Assessment & Plan Note (Signed)
Raymond Mckinney has completed 8 weeks of Mavyret for chronic hepatitis C with good adherence and tolerance.  Reviewed previous lab work and discussed plan of care.  Check blood work.  Plan for follow-up in 3 months or sooner if needed for lab work to confirm sustained viremic response.

## 2023-01-23 NOTE — Progress Notes (Signed)
Subjective:    Patient ID: Raymond Mckinney, male    DOB: 01-Jul-1981, 42 y.o.   MRN: [id number]  Chief Complaint  Patient presents with   Follow-up    Chronic hepatitis C without hepatic coma [facility])      HPI:  Raymond Mckinney is a 42 y.o. male with chronic hepatitis C last seen on 12/20/2022 by Margarite Gouge, PharmD, CPP with good adherence and tolerance to Mavyret following completion of the first month.  Hepatitis C RNA level was undetectable.  Here today for end of treatment visit.  Duey has been doing well since last office visit.  Initially had headache/nausea with the first few doses of Mavyret that gradually improved.  Has completed the course as prescribed.  No current symptoms and denies abdominal pain, nausea, vomiting, fatigue, fever, scleral icterus or jaundice.   Allergies  Allergen Reactions   Shellfish Allergy Anaphylaxis and Hives   Penicillins Other (See Comments)    As child      Outpatient Medications Prior to Visit  Medication Sig Dispense Refill   Aspirin-Salicylamide-Caffeine (BC HEADACHE POWDER PO) Take 1 Package by mouth as needed (Headache).     psyllium (METAMUCIL) 58.6 % powder Take 1 packet by mouth daily. 30 ml     Sod Picosulfate-Mag Ox-Cit Acd (CLENPIQ) 10-3.5-12 MG-GM -GM/175ML SOLN Take 1 kit by mouth as directed. 350 mL 0   Glecaprevir-Pibrentasvir (MAVYRET) 100-40 MG TABS Take 3 tablets by mouth daily with breakfast. 84 tablet 1   No facility-administered medications prior to visit.     Past Medical History:  Diagnosis Date   Anxiety    Brain injury (HCC) 08/2015   Depression    Hypertension    Seizures (HCC)    08/2015     Past Surgical History:  Procedure Laterality Date   COLONOSCOPY WITH PROPOFOL N/A 12/20/2022   Procedure: COLONOSCOPY WITH PROPOFOL;  Surgeon: Corbin Ade, MD;  Location: [facility];  Service: Endoscopy;  Laterality: N/A;  1:00pm, asa 2   HAND SURGERY     OTHER SURGICAL HISTORY Left Hand surgery        Review of Systems  Constitutional:  Negative for chills, diaphoresis, fatigue and fever.  Respiratory:  Negative for cough, chest tightness, shortness of breath and wheezing.   Cardiovascular:  Negative for chest pain.  Gastrointestinal:  Negative for abdominal distention, abdominal pain, constipation, diarrhea, nausea and vomiting.  Neurological:  Negative for weakness and headaches.  Hematological:  Does not bruise/bleed easily.      Objective:    BP 117/80   Pulse 79   Temp 98 F (36.7 C) (Oral)   Resp 16   Wt 162 lb 3.2 oz (73.6 kg)   SpO2 97%   BMI 26.18 kg/m  Nursing note and vital signs reviewed.  Physical Exam Constitutional:      General: He is not in acute distress.    Appearance: He is well-developed.  Cardiovascular:     Rate and Rhythm: Normal rate and regular rhythm.     Heart sounds: Normal heart sounds. No murmur heard.    No friction rub. No gallop.  Pulmonary:     Effort: Pulmonary effort is normal. No respiratory distress.     Breath sounds: Normal breath sounds. No wheezing or rales.  Chest:     Chest wall: No tenderness.  Abdominal:     General: Bowel sounds are normal. There is no distension.     Palpations: Abdomen is soft. There  is no mass.     Tenderness: There is no abdominal tenderness. There is no guarding or rebound.  Skin:    General: Skin is warm and dry.  Neurological:     Mental Status: He is alert and oriented to person, place, and time.  Psychiatric:        Behavior: Behavior normal.        Thought Content: Thought content normal.        Judgment: Judgment normal.         01/23/2023    3:54 PM 11/22/2022    2:36 PM 11/06/2022    2:44 PM 10/19/2022    8:02 AM 10/05/2022    9:07 AM  Depression screen PHQ 2/9  Decreased Interest 0 0 0 0 0  Down, Depressed, Hopeless 0 0 0 0 0  PHQ - 2 Score 0 0 0 0 0  Altered sleeping   0 0 0  Tired, decreased energy   0 0 0  Change in appetite   0 0 0  Feeling bad or failure about  yourself    0 0 0  Trouble concentrating   0 0 0  Moving slowly or fidgety/restless   0 0 0  Suicidal thoughts   0 0 0  PHQ-9 Score   0 0 0  Difficult doing work/chores   Not difficult at all Not difficult at all Not difficult at all       Assessment & Plan:    Patient Active Problem List   Diagnosis Date Noted   Rectal bleeding 12/10/2022   Family history of colon cancer 12/10/2022   Chronic hepatitis C without hepatic coma (HCC) 11/06/2022   Seizure disorder (HCC) 10/05/2022   History of methamphetamine abuse (HCC) 08/21/2020   GAD (generalized anxiety disorder) 05/27/2015     Problem List Items Addressed This Visit       Digestive   Chronic hepatitis C without hepatic coma (HCC) - Primary    Mr. Lardy has completed 8 weeks of Mavyret for chronic hepatitis C with good adherence and tolerance.  Reviewed previous lab work and discussed plan of care.  Check blood work.  Plan for follow-up in 3 months or sooner if needed for lab work to confirm sustained viremic response.      Relevant Orders   Hepatitis C RNA quantitative     I have discontinued Viann Fish Mavyret. I am also having him maintain his Aspirin-Salicylamide-Caffeine (BC HEADACHE POWDER PO), Clenpiq, and psyllium.   Follow-up: Return in about 3 months (around 04/25/2023).   Marcos Eke, MSN, FNP-C Nurse Practitioner [facility] for [facility]ious [facility] Main number: [phone]

## 2023-01-25 LAB — HEPATITIS C RNA QUANTITATIVE
HCV Quantitative Log: <1.18 log IU/mL
HCV RNA, PCR, QN: <15 IU/mL

## 2023-01-30 ENCOUNTER — Other Ambulatory Visit: Payer: Self-pay

## 2023-04-25 ENCOUNTER — Encounter: Payer: Self-pay | Admitting: Family

## 2023-04-25 ENCOUNTER — Other Ambulatory Visit: Payer: Self-pay

## 2023-04-25 ENCOUNTER — Ambulatory Visit: Payer: Medicaid Other | Admitting: Family

## 2023-04-25 VITALS — BP 120/76 | HR 80 | Resp 16 | Ht 66.0 in | Wt 158.0 lb

## 2023-04-25 DIAGNOSIS — B182 Chronic viral hepatitis C: Secondary | ICD-10-CM

## 2023-04-25 NOTE — Progress Notes (Signed)
Subjective:    Patient ID: Raymond Mckinney, male    DOB: 1980/12/11, 42 y.o.   MRN: [id number]  Chief Complaint  Patient presents with   Follow-up    Hepatitis C    HPI:  Raymond Mckinney is a 42 y.o. male with chronic Hepatitis C last seen on 01/23/23 at the completion of 8 weeks of Mavyret with Hepatitis C RNA level undetectable. Here today for follow up.  Raymond Mckinney has been doing well since his last office visit with no new concerns/complaints. Feeling well. Denies abdominal pain, nausea, vomiting, fatigue, fever, scleral icterus or jaundice.     Allergies  Allergen Reactions   Shellfish Allergy Anaphylaxis and Hives   Penicillins Other (See Comments)    As child      Outpatient Medications Prior to Visit  Medication Sig Dispense Refill   Aspirin-Salicylamide-Caffeine (BC HEADACHE POWDER PO) Take 1 Package by mouth as needed (Headache).     No facility-administered medications prior to visit.     Past Medical History:  Diagnosis Date   Anxiety    Brain injury (HCC) 08/2015   Depression    Hypertension    Seizures (HCC)    08/2015     Past Surgical History:  Procedure Laterality Date   COLONOSCOPY WITH PROPOFOL N/A 12/20/2022   Procedure: COLONOSCOPY WITH PROPOFOL;  Surgeon: Corbin Ade, MD;  [facility];  Service: Endoscopy;  Laterality: N/A;  1:00pm, asa 2   HAND SURGERY     OTHER SURGICAL HISTORY Left Hand surgery       Review of Systems  Constitutional:  Negative for chills, diaphoresis, fatigue and fever.  Respiratory:  Negative for cough, chest tightness, shortness of breath and wheezing.   Cardiovascular:  Negative for chest pain.  Gastrointestinal:  Negative for abdominal distention, abdominal pain, constipation, diarrhea, nausea and vomiting.  Neurological:  Negative for weakness and headaches.  Hematological:  Does not bruise/bleed easily.      Objective:    BP 120/76   Pulse 80   Resp 16   Ht 5\' 6"  (1.676 m)   Wt 158 lb (71.7  kg)   BMI 25.50 kg/m  Nursing note and vital signs reviewed.  Physical Exam Constitutional:      General: He is not in acute distress.    Appearance: He is well-developed.  Cardiovascular:     Rate and Rhythm: Normal rate and regular rhythm.     Heart sounds: Normal heart sounds. No murmur heard.    No friction rub. No gallop.  Pulmonary:     Effort: Pulmonary effort is normal. No respiratory distress.     Breath sounds: Normal breath sounds. No wheezing or rales.  Chest:     Chest wall: No tenderness.  Abdominal:     General: Bowel sounds are normal. There is no distension.     Palpations: Abdomen is soft. There is no mass.     Tenderness: There is no abdominal tenderness. There is no guarding or rebound.  Skin:    General: Skin is warm and dry.  Neurological:     Mental Status: He is alert and oriented to person, place, and time.  Psychiatric:        Behavior: Behavior normal.        Thought Content: Thought content normal.        Judgment: Judgment normal.         04/25/2023    3:40 PM 01/23/2023    3:54  PM 11/22/2022    2:36 PM 11/06/2022    2:44 PM 10/19/2022    8:02 AM  Depression screen PHQ 2/9  Decreased Interest 0 0 0 0 0  Down, Depressed, Hopeless 0 0 0 0 0  PHQ - 2 Score 0 0 0 0 0  Altered sleeping    0 0  Tired, decreased energy    0 0  Change in appetite    0 0  Feeling bad or failure about yourself     0 0  Trouble concentrating    0 0  Moving slowly or fidgety/restless    0 0  Suicidal thoughts    0 0  PHQ-9 Score    0 0  Difficult doing work/chores    Not difficult at all Not difficult at all       Assessment & Plan:    Patient Active Problem List   Diagnosis Date Noted   Rectal bleeding 12/10/2022   Family history of colon cancer 12/10/2022   Chronic hepatitis C without hepatic coma (HCC) 11/06/2022   Seizure disorder (HCC) 10/05/2022   History of methamphetamine abuse (HCC) 08/21/2020   GAD (generalized anxiety disorder) 05/27/2015      Problem List Items Addressed This Visit       Digestive   Chronic hepatitis C without hepatic coma (HCC) - Primary    Raymond Mckinney has completed 8 weeks of Mavyret for treatment of Hepatitis C. Check Hepatitis C RNA level today for confirmation of sustain viremic response. Low risk for advanced fibrosis with no further liver cancer screenings indicated. Discussed if Hepatitis C testing is needed in the future will need to check Hepatitis C RNA as the Hepatitis C antibody testing will always be positive. Follow up with ID pending lab work results as needed.       Relevant Orders   Hepatitis C RNA quantitative     I am having Raymond Mckinney maintain his Aspirin-Salicylamide-Caffeine (BC HEADACHE POWDER PO).   Follow-up: As needed pending lab work results    Marcos Eke, MSN, FNP-C Nurse Practitioner [facility] for Infectious Disease [facility] Main number: [phone]

## 2023-04-25 NOTE — Patient Instructions (Addendum)
Nice to see you.  We will check your lab work today.  No further liver cancer screening is needed.  If you need to be tested for Hepatitis C in the future a Hepatitis C RNA level or viral load will need to be checked as the Hepatitis C antibody used for initial screening will always be positive.   Follow up with ID as needed pending lab work.   Have a great day and stay safe!  

## 2023-04-25 NOTE — Assessment & Plan Note (Signed)
Raymond Mckinney has completed 8 weeks of Mavyret for treatment of Hepatitis C. Check Hepatitis C RNA level today for confirmation of sustain viremic response. Low risk for advanced fibrosis with no further liver cancer screenings indicated. Discussed if Hepatitis C testing is needed in the future will need to check Hepatitis C RNA as the Hepatitis C antibody testing will always be positive. Follow up with ID pending lab work results as needed.

## 2023-04-29 LAB — HEPATITIS C RNA QUANTITATIVE
HCV Quantitative Log: <1.18 {Log_IU}/mL
HCV RNA, PCR, QN: <15 [IU]/mL

## 2023-05-08 DIAGNOSIS — Z3009 Encounter for other general counseling and advice on contraception: Secondary | ICD-10-CM | POA: Diagnosis not present

## 2023-06-28 ENCOUNTER — Other Ambulatory Visit: Payer: Self-pay

## 2023-07-03 ENCOUNTER — Ambulatory Visit: Payer: Medicaid Other | Admitting: Family Medicine

## 2023-07-03 VITALS — BP 103/64 | HR 69 | Temp 98.7°F | Ht 66.0 in | Wt 162.0 lb

## 2023-07-03 DIAGNOSIS — R6889 Other general symptoms and signs: Secondary | ICD-10-CM | POA: Diagnosis not present

## 2023-07-03 DIAGNOSIS — F411 Generalized anxiety disorder: Secondary | ICD-10-CM

## 2023-07-03 DIAGNOSIS — R002 Palpitations: Secondary | ICD-10-CM | POA: Diagnosis not present

## 2023-07-03 DIAGNOSIS — R079 Chest pain, unspecified: Secondary | ICD-10-CM

## 2023-07-03 MED ORDER — PROPRANOLOL HCL 20 MG PO TABS
20.0000 mg | ORAL_TABLET | Freq: Two times a day (BID) | ORAL | 0 refills | Status: DC
Start: 2023-07-03 — End: 2023-07-30

## 2023-07-03 NOTE — Progress Notes (Signed)
Subjective:  Patient ID: Raymond Mckinney, male    DOB: 12-Dec-1980, 42 y.o.   MRN: [id number]  Patient Care Team: Arrie Senate, FNP as PCP - General (Family Medicine) Lonie Peak, PA-C (Physician Assistant)   Chief Complaint:  need referral for cardiology/stress test (Since last week - heart flutter, headache, fatigue, pain left side of neck)  HPI: Raymond Mckinney is a 42 y.o. male presenting on 07/03/2023 for need referral for cardiology/stress test (Since last week - heart flutter, headache, fatigue, pain left side of neck)  States that he continues to have palpitations/flutters. States that Thursday, Friday, and Saturday felt chest pain, that then radiated to his neck, then produced a headache. Happened this morning as well. Reports pain as 5/10. States that it is sharp/throbbing, sometimes it is dull ache. Reports that Friday it was a soreness like a pulled muscle. Thursday lasted 20-30 minutes, but off and on all day. States that he drinks 1-2 cups of coffee every morning and 2 BC powders every morning (this is a decrease from previously taking 6 throughout the day). Stopped Monster energy drinks 6 months ago. Drinks water throughout the day. Endorses some shortness of breath on Thursday and dizziness today. Does not have worsening pain with breathing. Does not improve or worsen with movement, pressing a hand on it, or stretching.  States that he does feel anxious, but that he is managing with his faith.   Relevant past medical, surgical, family, and social history reviewed and updated as indicated.  Allergies and medications reviewed and updated. Data reviewed: Chart in Epic.   Past Medical History:  Diagnosis Date   Anxiety    Brain injury (HCC) 08/2015   Depression    Hypertension    Seizures (HCC)    08/2015    Past Surgical History:  Procedure Laterality Date   COLONOSCOPY WITH PROPOFOL N/A 12/20/2022   Procedure: COLONOSCOPY WITH PROPOFOL;  Surgeon: Corbin Ade, MD;  [facility];  Service: Endoscopy;  Laterality: N/A;  1:00pm, asa 2   HAND SURGERY     OTHER SURGICAL HISTORY Left Hand surgery    Social History   Socioeconomic History   Marital status: Married    Spouse name: Not on file   Number of children: 5   Years of education: GED   Highest education level: GED or equivalent  Occupational History    Comment: [employer]  Tobacco Use   Smoking status: Every Day    Current packs/day: 1.50    Types: Cigarettes   Smokeless tobacco: Never  Vaping Use   Vaping status: Never Used  Substance and Sexual Activity   Alcohol use: Not Currently   Drug use: Not Currently    Types: Marijuana, Methamphetamines    Comment: meth last night- MJ once a month- Last used December 30th 2021.   Sexual activity: Not on file  Other Topics Concern   Not on file  Social History Narrative   Lives with family   Caffeine- Mtn Dew once a day, lots of tea   Social Determinants of Health   Financial Resource Strain: Medium Risk (07/03/2023)   Overall Financial Resource Strain (CARDIA)    Difficulty of Paying Living Expenses: Somewhat hard  Food Insecurity: No Food Insecurity (07/03/2023)   Hunger Vital Sign    Worried About Running Out of Food in the Last Year: Never true    Ran Out of Food in the Last Year: Never true  Transportation Needs: No Transportation Needs (07/03/2023)   PRAPARE - Administrator, Civil Service (Medical): No    Lack of Transportation (Non-Medical): No  Physical Activity: Unknown (07/03/2023)   Exercise Vital Sign    Days of Exercise per Week: 0 days    Minutes of Exercise per Session: Not on file  Stress: Stress Concern Present (07/03/2023)   [employer] of Occupational Health - Occupational Stress Questionnaire    Feeling of Stress : To some extent  Social Connections: Socially Integrated (07/03/2023)   Social Connection and Isolation Panel [NHANES]    Frequency of  Communication with Friends and Family: Three times a week    Frequency of Social Gatherings with Friends and Family: Three times a week    Attends Religious Services: More than 4 times per year    Active Member of Clubs or Organizations: Yes    Attends Banker Meetings: More than 4 times per year    Marital Status: Married  Catering manager Violence: Not on file    Outpatient Encounter Medications as of 07/03/2023  Medication Sig   [DISCONTINUED] Aspirin-Salicylamide-Caffeine (BC HEADACHE POWDER PO) Take 1 Package by mouth as needed (Headache).   No facility-administered encounter medications on file as of 07/03/2023.    Allergies  Allergen Reactions   Shellfish Allergy Anaphylaxis and Hives   Penicillins Other (See Comments)    As child    Review of Systems As per HPI  Objective:  BP 103/64   Pulse 69   Temp 98.7 F (37.1 C)   Ht 5\' 6"  (1.676 m)   Wt 162 lb (73.5 kg)   SpO2 97%   BMI 26.15 kg/m    Wt Readings from Last 3 Encounters:  07/03/23 162 lb (73.5 kg)  04/25/23 158 lb (71.7 kg)  01/23/23 162 lb 3.2 oz (73.6 kg)    Physical Exam Constitutional:      General: He is awake. He is not in acute distress.    Appearance: Normal appearance. He is well-developed and well-groomed. He is not ill-appearing, toxic-appearing or diaphoretic.  Cardiovascular:     Rate and Rhythm: Normal rate and regular rhythm.     Pulses: Normal pulses.          Radial pulses are 2+ on the right side and 2+ on the left side.       Posterior tibial pulses are 2+ on the right side and 2+ on the left side.     Heart sounds: Normal heart sounds. No murmur heard.    No gallop.  Pulmonary:     Effort: Pulmonary effort is normal. No respiratory distress.     Breath sounds: Normal breath sounds. No stridor. No wheezing, rhonchi or rales.  Musculoskeletal:     Cervical back: Full passive range of motion without pain and neck supple.     Right lower leg: No edema.     Left lower  leg: No edema.  Skin:    General: Skin is warm.     Capillary Refill: Capillary refill takes less than 2 seconds.  Neurological:     General: No focal deficit present.     Mental Status: He is alert, oriented to person, place, and time and easily aroused. Mental status is at baseline.     GCS: GCS eye subscore is 4. GCS verbal subscore is 5. GCS motor subscore is 6.     Motor: No weakness.  Psychiatric:        Attention  and Perception: Attention and perception normal.        Mood and Affect: Mood and affect normal.        Speech: Speech normal.        Behavior: Behavior normal. Behavior is cooperative.        Thought Content: Thought content normal. Thought content does not include homicidal or suicidal ideation. Thought content does not include homicidal or suicidal plan.        Cognition and Memory: Cognition and memory normal.        Judgment: Judgment normal.     Results for orders placed or performed in visit on 04/25/23  Hepatitis C RNA quantitative  Result Value Ref Range   HCV RNA, PCR, QN <15 IU/mL   HCV Quantitative Log <1.18 log IU/mL       07/03/2023    3:25 PM 04/25/2023    3:40 PM 01/23/2023    3:54 PM 11/22/2022    2:36 PM 11/06/2022    2:44 PM  Depression screen PHQ 2/9  Decreased Interest  0 0 0 0  Down, Depressed, Hopeless 0 0 0 0 0  PHQ - 2 Score 0 0 0 0 0  Altered sleeping 0    0  Tired, decreased energy 2    0  Change in appetite 0    0  Feeling bad or failure about yourself  0    0  Trouble concentrating 0    0  Moving slowly or fidgety/restless 2    0  Suicidal thoughts 0    0  PHQ-9 Score 4    0  Difficult doing work/chores Somewhat difficult    Not difficult at all       07/03/2023    3:43 PM 10/19/2022    8:02 AM 10/05/2022    9:07 AM  GAD 7 : Generalized Anxiety Score  Nervous, Anxious, on Edge 3 1 2   Control/stop worrying 0 0 0  Worry too much - different things 0 0 0  Trouble relaxing 0 0 0  Restless 3 0 0  Easily annoyed or irritable 1 1  0  Afraid - awful might happen 0 0 0  Total GAD 7 Score 7 2 2   Anxiety Difficulty Somewhat difficult Somewhat difficult Somewhat difficult    Pertinent labs & imaging results that were available during my care of the patient were reviewed by me and considered in my medical decision making.  Assessment & Plan:  Dorland was seen today for need referral for cardiology/stress test.  Diagnoses and all orders for this visit:  Palpitations Referral placed as below. Reviewed zio from 11/06/22. Did not have any acute abnormalities. Did have some ectopic beats and change in morphology. Will start medication as below. Labs as below. Will communicate results to patient once available. Will await results to determine next steps.  -     Ambulatory referral to Cardiac Electrophysiology -     propranolol (INDERAL) 20 MG tablet; Take 1 tablet (20 mg total) by mouth 2 (two) times daily. -     Anemia Profile B -     CMP14+EGFR -     TSH  Chest pain, unspecified type As above. Reviewed Red flag symptoms.  -     Ambulatory referral to Cardiac Electrophysiology -     propranolol (INDERAL) 20 MG tablet; Take 1 tablet (20 mg total) by mouth 2 (two) times daily. -     Anemia Profile B -  CMP14+EGFR -     TSH  GAD (generalized anxiety disorder) Pt screened positive for anxiety today. Pt offered nonpharmacologic and pharmacologic therapy. Pt declined initiating treatment at this time. Safety contract established today with patient in clinic. Denies intent to harm herself or others. Pt to notify provider if they would like to initiate treatment. Managing with faith.   Continue all other maintenance medications.  Follow up plan: Return if symptoms worsen or fail to improve.   Continue healthy lifestyle choices, including diet (rich in fruits, vegetables, and lean proteins, and low in salt and simple carbohydrates) and exercise (at least 30 minutes of moderate physical activity daily).  Written and verbal  instructions provided   The above assessment and management plan was discussed with the patient. The patient verbalized understanding of and has agreed to the management plan. Patient is aware to call the clinic if they develop any new symptoms or if symptoms persist or worsen. Patient is aware when to return to the clinic for a follow-up visit. Patient educated on when it is appropriate to go to the emergency department.   Neale Burly, DNP-FNP [facility] [address] [place], [place] [address] [phone]

## 2023-07-04 LAB — CMP14+EGFR
ALT: 15 IU/L (ref 0–44)
AST: 20 [IU]/L (ref 0–40)
Albumin: 4.5 g/dL (ref 4.1–5.1)
Alkaline Phosphatase: 69 [IU]/L (ref 44–121)
BUN/Creatinine Ratio: 16 (ref 9–20)
BUN: 17 mg/dL (ref 6–24)
Bilirubin Total: 0.5 mg/dL (ref 0.0–1.2)
CO2: 23 mmol/L (ref 20–29)
Calcium: 10 mg/dL (ref 8.7–10.2)
Chloride: 100 mmol/L (ref 96–106)
Creatinine, Ser: 1.04 mg/dL (ref 0.76–1.27)
Globulin, Total: 2.9 g/dL (ref 1.5–4.5)
Glucose: 81 mg/dL (ref 70–99)
Potassium: 4.8 mmol/L (ref 3.5–5.2)
Sodium: 139 mmol/L (ref 134–144)
Total Protein: 7.4 g/dL (ref 6.0–8.5)
eGFR: 93 mL/min/{1.73_m2} (ref >59)

## 2023-07-04 LAB — ANEMIA PROFILE B
Basophils Absolute: 0.1 10*3/uL (ref 0.0–0.2)
Basos: 1 %
EOS (ABSOLUTE): 0.3 10*3/uL (ref 0.0–0.4)
Eos: 4 %
Ferritin: 49 ng/mL (ref 30–400)
Folate: 12.2 ng/mL (ref >3.0)
Hematocrit: 50.1 % (ref 37.5–51.0)
Hemoglobin: 16.4 g/dL (ref 13.0–17.7)
Immature Grans (Abs): 0 10*3/uL (ref 0.0–0.1)
Immature Granulocytes: 0 %
Iron Saturation: 36 % (ref 15–55)
Iron: 134 ug/dL (ref 38–169)
Lymphocytes Absolute: 2.7 10*3/uL (ref 0.7–3.1)
Lymphs: 38 %
MCH: 29.7 pg (ref 26.6–33.0)
MCHC: 32.7 g/dL (ref 31.5–35.7)
MCV: 91 fL (ref 79–97)
Monocytes Absolute: 0.7 10*3/uL (ref 0.1–0.9)
Monocytes: 10 %
Neutrophils Absolute: 3.4 10*3/uL (ref 1.4–7.0)
Neutrophils: 47 %
Platelets: 366 10*3/uL (ref 150–450)
RBC: 5.53 x10E6/uL (ref 4.14–5.80)
RDW: 12.3 % (ref 11.6–15.4)
Retic Ct Pct: 1.3 % (ref 0.6–2.6)
Total Iron Binding Capacity: 370 ug/dL (ref 250–450)
UIBC: 236 ug/dL (ref 111–343)
Vitamin B-12: 696 pg/mL (ref 232–1245)
WBC: 7.2 10*3/uL (ref 3.4–10.8)

## 2023-07-04 LAB — TSH: TSH: 1.46 u[IU]/mL (ref 0.450–4.500)

## 2023-07-04 NOTE — Progress Notes (Signed)
Labs all normal. Will continue with current plan of propranolol and follow up with cardiology.

## 2023-07-30 ENCOUNTER — Other Ambulatory Visit: Payer: Self-pay | Admitting: Family Medicine

## 2023-07-30 DIAGNOSIS — R079 Chest pain, unspecified: Secondary | ICD-10-CM

## 2023-07-30 DIAGNOSIS — R002 Palpitations: Secondary | ICD-10-CM

## 2023-08-05 ENCOUNTER — Encounter: Payer: Self-pay | Admitting: Cardiovascular Disease

## 2023-08-05 ENCOUNTER — Ambulatory Visit: Payer: Medicaid Other | Attending: Cardiovascular Disease | Admitting: Cardiovascular Disease

## 2023-08-05 ENCOUNTER — Other Ambulatory Visit: Payer: Self-pay | Admitting: Family Medicine

## 2023-08-05 VITALS — BP 122/78 | HR 71 | Ht 67.0 in | Wt 160.2 lb

## 2023-08-05 DIAGNOSIS — R002 Palpitations: Secondary | ICD-10-CM | POA: Diagnosis not present

## 2023-08-05 DIAGNOSIS — R079 Chest pain, unspecified: Secondary | ICD-10-CM

## 2023-08-05 LAB — BASIC METABOLIC PANEL
BUN/Creatinine Ratio: 10 (ref 9–20)
BUN: 11 mg/dL (ref 6–24)
CO2: 25 mmol/L (ref 20–29)
Calcium: 10.3 mg/dL — ABNORMAL HIGH (ref 8.7–10.2)
Chloride: 103 mmol/L (ref 96–106)
Creatinine, Ser: 1.09 mg/dL (ref 0.76–1.27)
Glucose: 94 mg/dL (ref 70–99)
Potassium: 4.7 mmol/L (ref 3.5–5.2)
Sodium: 140 mmol/L (ref 134–144)
eGFR: 87 mL/min/{1.73_m2} (ref >59)

## 2023-08-05 NOTE — Patient Instructions (Signed)
Medication Instructions:  Your physician recommends that you continue on your current medications as directed. Please refer to the Current Medication list given to you today. *If you need a refill on your cardiac medications before your next appointment, please call your pharmacy*   Lab Work: BMET today If you have labs (blood work) drawn today and your tests are completely normal, you will receive your results only by: MyChart Message (if you have MyChart) OR A paper copy in the mail If you have any lab test that is abnormal or we need to change your treatment, we will call you to review the results.   Testing/Procedures: Coronary CT Your physician has requested that you have cardiac CT. Cardiac computed tomography (CT) is a painless test that uses an x-ray machine to take clear, detailed pictures of your heart. For further information please visit [email or web address] Please follow instruction sheet as given.   Follow-Up: At [facility], you and your health needs are our priority.  As part of our continuing mission to provide you with exceptional heart care, we have created designated Provider Care Teams.  These Care Teams include your primary Cardiologist (physician) and Advanced Practice Providers (APPs -  Physician Assistants and Nurse Practitioners) who all work together to provide you with the care you need, when you need it.  We recommend signing up for the patient portal called "MyChart".  Sign up information is provided on this After Visit Summary.  MyChart is used to connect with patients for Virtual Visits (Telemedicine).  Patients are able to view lab/test results, encounter notes, upcoming appointments, etc.  Non-urgent messages can be sent to your provider as well.   To learn more about what you can do with MyChart, go to [email or web address].    Your next appointment:   Follow up with general cardiology   Provider:   Needs to be established with general  cardiology

## 2023-08-05 NOTE — Progress Notes (Signed)
  Electrophysiology Office Note:    Date:  08/05/2023   ID:  Raymond, Mckinney 02-27-1981, MRN [id number]  PCP:  Arrie Senate, FNP   [facility] Providers Cardiologist:  None     Referring MD: Arrie Senate*   History of Present Illness:    Raymond Mckinney is a 42 y.o. male with referred for evaluation of palpitations.     I discussed the use of AI scribe software for clinical note transcription with the patient, who gave verbal consent to proceed.  I reviewed Charlott Holler most recent note.  The patient reports a history of smoking and heavy drug use in the past. He presents with a 2.5-3 year history of chest fluttering. Initially, the fluttering was infrequent, but over the past year, it has become a daily occurrence. The patient describes the sensation as 'alarming.' Over the past two months, the patient has experienced a 'stiff' sensation every two weeks, and three weeks ago, the patient experienced a flutter followed by neck pain and a headache in the back of the head. The fluttering can occur during both rest and exertion. The patient wore a heart monitor, which showed normal results despite the patient experiencing the fluttering sensation during the monitoring period.  The patient also experiences chest discomfort, described as ranging from a dull to sharp pain, sometimes feeling like a 'stab.' This discomfort only occurs during the fluttering episodes and can be triggered by exertion and sexual activity. The discomfort radiates up to the patient's neck.     Today, he reports that he is doing well. No palpitations at present.  EKGs/Labs/Other Studies Reviewed Today:     Monitors:  14 day Zio 09/2022 Sinus rhythm heart rate 51- 143, AVG 81 bpm. Rare ectopy, < 1% There are multiple patient triggered events that correlated with sinus rhythm No arrhythmia detected   EKG:   EKG Interpretation Date/Time:  Monday August 05 2023 08:30:41  EST Ventricular Rate:  71 PR Interval:  142 QRS Duration:  96 QT Interval:  356 QTC Calculation: 386 R Axis:   84  Text Interpretation: Normal sinus rhythm Normal ECG When compared with ECG of 21-Aug-2020 14:54, Rate has slowed Confirmed by York Pellant [phone]) on 08/05/2023 8:47:04 AM     Physical Exam:    VS:  BP 122/78 (BP Location: Left Arm, Patient Position: Sitting, Cuff Size: Normal)   Pulse 71   Ht 5\' 7"  (1.702 m)   Wt 160 lb 3.2 oz (72.7 kg)   SpO2 99%   BMI 25.09 kg/m     Wt Readings from Last 3 Encounters:  08/05/23 160 lb 3.2 oz (72.7 kg)  07/03/23 162 lb (73.5 kg)  04/25/23 158 lb (71.7 kg)     GEN: Well nourished, well developed in no acute distress CARDIAC: RRR, no murmurs, rubs, gallops RESPIRATORY:  Normal work of breathing MUSCULOSKELETAL: no edema    ASSESSMENT & PLAN:     Palpitations Correlate with sinus rhythm on monitor Continue propranolol No arrhythmia detected  Chest discomfort Occurs with palpitations Will order coronary CT for screening  The patient may follow-up in general cardiology clinic. I will be happy to see him again if he develops any rhythm issues.    Signed, Maurice Small, MD  08/05/2023 8:51 AM    [facility]

## 2023-08-12 ENCOUNTER — Telehealth (HOSPITAL_COMMUNITY): Payer: Self-pay | Admitting: *Deleted

## 2023-08-12 NOTE — Telephone Encounter (Signed)
Attempted to call patient regarding upcoming cardiac CT appointment. Left message on voicemail with name and callback number Hayley Sharpe RN Navigator Cardiac Imaging [facility] [phone] Office   

## 2023-08-13 ENCOUNTER — Ambulatory Visit (HOSPITAL_COMMUNITY)
Admission: RE | Admit: 2023-08-13 | Discharge: 2023-08-13 | Disposition: A | Discharge status: Home / Self Care | Payer: Medicaid Other | Source: Ambulatory Visit | Attending: Cardiovascular Disease | Admitting: Cardiovascular Disease

## 2023-08-13 DIAGNOSIS — R079 Chest pain, unspecified: Secondary | ICD-10-CM | POA: Insufficient documentation

## 2023-08-13 DIAGNOSIS — R072 Precordial pain: Secondary | ICD-10-CM | POA: Diagnosis not present

## 2023-08-13 MED ORDER — DILTIAZEM HCL 25 MG/5ML IV SOLN: 10.0000 mg | INTRAVENOUS | Status: DC | PRN

## 2023-08-13 MED ORDER — IOHEXOL 350 MG/ML SOLN
95.0000 mL | Freq: Once | INTRAVENOUS | Status: AC | PRN
  Administered 2023-08-13: 95 mL via INTRAVENOUS

## 2023-08-13 MED ORDER — NITROGLYCERIN 0.4 MG SL SUBL
SUBLINGUAL_TABLET | SUBLINGUAL | Status: AC
  Filled 2023-08-13: qty 2

## 2023-08-13 MED ORDER — SODIUM CHLORIDE 0.9 % IV BOLUS
500.0000 mL | Freq: Once | INTRAVENOUS | Status: AC
  Administered 2023-08-13: 500 mL via INTRAVENOUS

## 2023-08-13 MED ORDER — METOPROLOL TARTRATE 5 MG/5ML IV SOLN
INTRAVENOUS | Status: AC
  Filled 2023-08-13: qty 10

## 2023-08-13 MED ORDER — NITROGLYCERIN 0.4 MG SL SUBL
0.8000 mg | SUBLINGUAL_TABLET | Freq: Once | SUBLINGUAL | Status: AC
  Administered 2023-08-13: 0.8 mg via SUBLINGUAL

## 2023-08-13 MED ORDER — METOPROLOL TARTRATE 5 MG/5ML IV SOLN: 10.0000 mg | INTRAVENOUS | Status: DC | PRN

## 2023-08-23 NOTE — Telephone Encounter (Signed)
 Med Rec complete, allergies and Pharmacy verified January 3

## 2023-08-26 ENCOUNTER — Telehealth: Payer: Self-pay | Admitting: *Deleted

## 2023-08-26 ENCOUNTER — Ambulatory Visit: Payer: Medicaid Other | Admitting: Cardiology

## 2023-08-26 NOTE — Telephone Encounter (Signed)
 S/w pt due to snow and opening late. R/S pt with Dr. Anne Fu.

## 2023-09-17 ENCOUNTER — Encounter: Payer: Self-pay | Admitting: Cardiology

## 2023-09-17 ENCOUNTER — Ambulatory Visit: Payer: Medicaid Other | Attending: Cardiology | Admitting: Cardiology

## 2023-09-17 VITALS — BP 118/60 | HR 80 | Ht 67.0 in | Wt 159.2 lb

## 2023-09-17 DIAGNOSIS — R079 Chest pain, unspecified: Secondary | ICD-10-CM

## 2023-09-17 DIAGNOSIS — I2510 Atherosclerotic heart disease of native coronary artery without angina pectoris: Secondary | ICD-10-CM

## 2023-09-17 MED ORDER — ASPIRIN 81 MG PO TBEC: 81.0000 mg | DELAYED_RELEASE_TABLET | Freq: Every day | ORAL | Status: AC

## 2023-09-17 NOTE — Patient Instructions (Signed)
Medication Instructions:  Please start Aspirin 81 mg a day. Continue all other medications as listed.  *If you need a refill on your cardiac medications before your next appointment, please call your pharmacy*  Follow-Up: At [facility], you and your health needs are our priority.  As part of our continuing mission to provide you with exceptional heart care, we have created designated Provider Care Teams.  These Care Teams include your primary Cardiologist (physician) and Advanced Practice Providers (APPs -  Physician Assistants and Nurse Practitioners) who all work together to provide you with the care you need, when you need it.  We recommend signing up for the patient portal called "MyChart".  Sign up information is provided on this After Visit Summary.  MyChart is used to connect with patients for Virtual Visits (Telemedicine).  Patients are able to view lab/test results, encounter notes, upcoming appointments, etc.  Non-urgent messages can be sent to your provider as well.   To learn more about what you can do with MyChart, go to [email or web address].    Your next appointment:   Follow up as needed with Dr Anne Fu  Mediterranean Diet A Mediterranean diet is based on the traditions of countries on the [employer]. It focuses on eating more: Fruits and vegetables. Whole grains, beans, nuts, and seeds. Heart-healthy fats. These are fats that are good for your heart. It involves eating less: Dairy. Meat and eggs. Processed foods with added sugar, salt, and fat. This type of diet can help prevent certain conditions. It can also improve outcomes if you have a long-term (chronic) disease, such as kidney or heart disease. What are tips for following this plan? Reading food labels Check packaged foods for: The serving size. For foods such as rice and pasta, the serving size is the amount of cooked product, not dry. The total fat. Avoid foods with saturated fat or trans  fat. Added sugars, such as corn syrup. Shopping  Try to have a balanced diet. Buy a variety of foods, such as: Fresh fruits and vegetables. You may be able to get these from local farmers markets. You can also buy them frozen. Grains, beans, nuts, and seeds. Some of these can be bought in bulk. Fresh seafood. Poultry and eggs. Low-fat dairy products. Buy whole ingredients instead of foods that have already been packaged. If you can't get fresh seafood, buy precooked frozen shrimp or canned fish, such as tuna, salmon, or sardines. Stock your pantry so you always have certain foods on hand, such as olive oil, canned tuna, canned tomatoes, rice, pasta, and beans. Cooking Cook foods with extra-virgin olive oil instead of using butter or other vegetable oils. Have meat as a side dish. Have vegetables or grains as your main dish. This means having meat in small portions or adding small amounts of meat to foods like pasta or stew. Use beans or vegetables instead of meat in common dishes like chili or lasagna. Try out different cooking methods. Try roasting, broiling, steaming, and sauting vegetables. Add frozen vegetables to soups, stews, pasta, or rice. Add nuts or seeds for added healthy fats and plant protein at each meal. You can add these to yogurt, salads, or vegetable dishes. Marinate fish or vegetables using olive oil, lemon juice, garlic, and fresh herbs. Meal planning Plan to eat a vegetarian meal one day each week. Try to work up to two vegetarian meals, if possible. Eat seafood two or more times a week. Have healthy snacks on hand. These may include:  Vegetable sticks with hummus. Greek yogurt. Fruit and nut trail mix. Eat balanced meals. These should include: Fruit: 2-3 servings a day. Vegetables: 4-5 servings a day. Low-fat dairy: 2 servings a day. Fish, poultry, or lean meat: 1 serving a day. Beans and legumes: 2 or more servings a week. Nuts and seeds: 1-2 servings a  day. Whole grains: 6-8 servings a day. Extra-virgin olive oil: 3-4 servings a day. Limit red meat and sweets to just a few servings a month. Lifestyle  Try to cook and eat meals with your family. Drink enough fluid to keep your pee (urine) pale yellow. Be active every day. This includes: Aerobic exercise, which is exercise that causes your heart to beat faster. Examples include running and swimming. Leisure activities like gardening, walking, or housework. Get 7-8 hours of sleep each night. Drink red wine if your provider says you can. A glass of wine is 5 oz (150 mL). You may be allowed to have: Up to 1 glass a day if you're male and not pregnant. Up to 2 glasses a day if you're male. What foods should I eat? Fruits Apples. Apricots. Avocado. Berries. Bananas. Cherries. Dates. Figs. Grapes. Lemons. Melon. Oranges. Peaches. Plums. Pomegranate. Vegetables Artichokes. Beets. Broccoli. Cabbage. Carrots. Eggplant. Green beans. Chard. Kale. Spinach. Onions. Leeks. Peas. Squash. Tomatoes. Peppers. Radishes. Grains Whole-grain pasta. Brown rice. Bulgur wheat. Polenta. Couscous. Whole-wheat bread. Orpah Cobb. Meats and other proteins Beans. Almonds. Sunflower seeds. Pine nuts. Peanuts. Cod. Salmon. Scallops. Shrimp. Tuna. Tilapia. Clams. Oysters. Eggs. Chicken or [place] without skin. Dairy Low-fat milk. Cheese. Greek yogurt. Fats and oils Extra-virgin olive oil. Avocado oil. Grapeseed oil. Beverages Water. Red wine. Herbal tea. Sweets and desserts Greek yogurt with honey. Baked apples. Poached pears. Trail mix. Seasonings and condiments Basil. Cilantro. Coriander. Cumin. Mint. Parsley. Sage. Rosemary. Tarragon. Garlic. Oregano. Thyme. Pepper. Balsamic vinegar. Tahini. Hummus. Tomato sauce. Olives. Mushrooms. The items listed above may not be all the foods and drinks you can have. Talk to a dietitian to learn more. What foods should I limit? This is a list of foods that should be  eaten rarely. Fruits Fruit canned in syrup. Vegetables Deep-fried potatoes, like [place] fries. Grains Packaged pasta or rice dishes. Cereal with added sugar. Snacks with added sugar. Meats and other proteins Beef. Pork. Lamb. Chicken or [place] with skin. Hot dogs. Tomasa Blase. Dairy Ice cream. Sour cream. Whole milk. Fats and oils Butter. Canola oil. Vegetable oil. Beef fat (tallow). Lard. Beverages Juice. Sugar-sweetened soft drinks. Beer. Liquor and spirits. Sweets and desserts Cookies. Cakes. Pies. Candy. Seasonings and condiments Mayonnaise. Pre-made sauces and marinades. The items listed above may not be all the foods and drinks you should limit. Talk to a dietitian to learn more. Where to find more information American Heart Association (AHA): heart.org This information is not intended to replace advice given to you by your health care provider. Make sure you discuss any questions you have with your health care provider. Document Revised: 11/18/2022 Document Reviewed: 11/18/2022 Elsevier Patient Education  2024 [employer].

## 2023-09-17 NOTE — Progress Notes (Signed)
Cardiology Office Note:  .   Date:  09/17/2023  ID:  Raymond Mckinney, DOB [birth year], MRN [id number] PCP: Arrie Senate, FNP  [facility] Providers Cardiologist:  None     History of Present Illness: .   Raymond Mckinney is a 43 y.o. male Discussed with the use of AI scribe  History of Present Illness   He is a 43 year old male who presents new to general cardiology with prior palpitations and chest discomfort.  He experiences palpitations and chest discomfort, which have been correlated with sinus rhythm on a monitor, with no arrhythmia detected (PAC/PVC's noted). He currently has propranolol for these symptoms.  A coronary CT scan performed on August 13, 2023, due to chest discomfort revealed a calcium score of 54, indicating minimal nonobstructive coronary artery disease with 1-24% stenosis. His LDL cholesterol was 55 in 2725 and increased to 94 on October 05, 2022. Recent lab results show a creatinine level of 1.09, potassium at 4.7, and TSH at 1.4.  He has a history of smoking and is currently trying to reduce his smoking habits.  Unknown family history of heart disease. No diabetes diagnosis is reported, and previous tests have always returned good results.          ROS: No syncope, no bleeding  Studies Reviewed: .        Results   LABS LDL: 94 (10/05/2022) Creatinine: 1.09 Potassium: 4.7 TSH: 1.4  RADIOLOGY Coronary CT scan: Calcium score of 54, minimal nonobstructive CAD, 1-24% (08/13/2023)  DIAGNOSTIC Cardiac monitor: Rare PACs, PVCs (10/2022)     Risk Assessment/Calculations:            Physical Exam:   VS:  BP 118/60   Pulse 80   Ht 5\' 7"  (1.702 m)   Wt 159 lb 3.2 oz (72.2 kg)   SpO2 98%   BMI 24.93 kg/m    Wt Readings from Last 3 Encounters:  09/17/23 159 lb 3.2 oz (72.2 kg)  08/05/23 160 lb 3.2 oz (72.7 kg)  07/03/23 162 lb (73.5 kg)    GEN: Well nourished, well developed in no acute distress NECK: No JVD; No carotid  bruits CARDIAC: RRR, no murmurs, no rubs, no gallops RESPIRATORY:  Clear to auscultation without rales, wheezing or rhonchi  ABDOMEN: Soft, non-tender, non-distended EXTREMITIES:  No edema; No deformity   ASSESSMENT AND PLAN: .    Assessment and Plan    Palpitations and Chest Discomfort 43 year old with palpitations and chest discomfort. Palpitations correlate with sinus rhythm; no arrhythmia detected. Previous coronary CT scan showed minimal nonobstructive CAD with a calcium score of 54. Recent labs: LDL 94 mg/dL, creatinine 3.66, potassium 4.7, TSH 1.4. Symptoms are not life-threatening but concerning to the patient. Explained that palpitations are common and not indicative of a life-threatening condition. Discussed lifestyle modifications to manage symptoms and prevent CAD progression. - Start aspirin 81 mg daily (plaque) - Recommend Mediterranean diet - Encourage 30 minutes of exercise daily - Advise smoking cessation - Follow up with primary care provider for cholesterol monitoring - Consider low-dose Crestor (10 mg) if cholesterol levels do not improve (LDL goal < 70)  Minimal Nonobstructive Coronary Artery Disease (CAD) Coronary CT scan on 08/13/2023 showed minimal nonobstructive CAD with a calcium score of 54. Goal is to prevent plaque progression. Discussed benefits of aspirin and Crestor in stabilizing plaque and reducing cholesterol. Explained risks of aspirin, including potential bleeding, and benefits of Crestor in lowering cholesterol and stabilizing plaque. Patient prefers  to start with lifestyle modifications and aspirin before considering Crestor. - Start aspirin 81 mg daily - Recommend Mediterranean diet - Encourage 30 minutes of exercise daily - Advise smoking cessation - Follow up with primary care provider for cholesterol monitoring - Consider low-dose Crestor (10 mg) if cholesterol levels do not improve  General Health Maintenance Discussed importance of lifestyle  modifications including diet and exercise for overall cardiovascular health. - Recommend Mediterranean diet - Encourage 30 minutes of exercise daily - Advise smoking cessation  Follow-up - Follow up with primary care provider for cholesterol monitoring - PRN follow-up with cardiology if symptoms worsen or for further evaluation.               Signed, Donato Schultz, MD

## 2024-07-03 NOTE — Telephone Encounter (Signed)
 Raymond Mckinney
